# Patient Record
Sex: Male | Born: 1935 | Race: Black or African American | Hispanic: No | Marital: Married | Smoking: Former smoker
Health system: Southern US, Community
[De-identification: ages and names within clinical notes are randomized; demographics above are authoritative.]

## PROBLEM LIST (undated history)

## (undated) DIAGNOSIS — I10 Essential (primary) hypertension: Secondary | ICD-10-CM

## (undated) DIAGNOSIS — E7800 Pure hypercholesterolemia, unspecified: Secondary | ICD-10-CM

## (undated) DIAGNOSIS — C801 Malignant (primary) neoplasm, unspecified: Secondary | ICD-10-CM

## (undated) HISTORY — PX: BACK SURGERY: SHX140

## (undated) HISTORY — PX: JOINT REPLACEMENT: SHX530

## (undated) HISTORY — PX: HERNIA REPAIR: SHX51

---

## 2004-01-18 ENCOUNTER — Encounter: Admission: RE | Admit: 2004-01-18 | Discharge: 2004-01-18 | Payer: Self-pay | Admitting: Orthopedic Surgery

## 2004-01-22 ENCOUNTER — Ambulatory Visit (HOSPITAL_COMMUNITY): Admission: RE | Admit: 2004-01-22 | Discharge: 2004-01-22 | Payer: Self-pay | Admitting: Orthopedic Surgery

## 2004-01-22 ENCOUNTER — Ambulatory Visit (HOSPITAL_BASED_OUTPATIENT_CLINIC_OR_DEPARTMENT_OTHER): Admission: RE | Admit: 2004-01-22 | Discharge: 2004-01-22 | Payer: Self-pay | Admitting: Orthopedic Surgery

## 2005-04-15 ENCOUNTER — Ambulatory Visit (HOSPITAL_COMMUNITY): Admission: RE | Admit: 2005-04-15 | Discharge: 2005-04-15 | Payer: Self-pay | Admitting: Dentistry

## 2005-07-21 ENCOUNTER — Ambulatory Visit (HOSPITAL_COMMUNITY): Admission: RE | Admit: 2005-07-21 | Discharge: 2005-07-21 | Payer: Self-pay | Admitting: General Surgery

## 2006-10-29 ENCOUNTER — Encounter: Admission: RE | Admit: 2006-10-29 | Discharge: 2006-10-29 | Payer: Self-pay | Admitting: Orthopaedic Surgery

## 2006-10-30 ENCOUNTER — Encounter: Admission: RE | Admit: 2006-10-30 | Discharge: 2006-10-30 | Payer: Self-pay | Admitting: Orthopaedic Surgery

## 2008-10-02 ENCOUNTER — Inpatient Hospital Stay (HOSPITAL_COMMUNITY): Admission: RE | Admit: 2008-10-02 | Discharge: 2008-10-06 | Payer: Self-pay | Admitting: Orthopedic Surgery

## 2011-04-14 NOTE — Op Note (Signed)
NAME:  Frank [name], Frank [name] NO.:  [id number]   MEDICAL RECORD NO.:  [id number]          PATIENT TYPE:  INP   LOCATION:  0005                         FACILITY:  [facility]   PHYSICIAN:  Deidre Ala, M.D.    DATE OF BIRTH:  [birth year]   DATE OF PROCEDURE:  10/02/2008  DATE OF DISCHARGE:                               OPERATIVE REPORT   PREOPERATIVE DIAGNOSIS:  End-stage degenerative joint disease, right  knee.   POSTOPERATIVE DIAGNOSIS:  End-stage degenerative joint disease, right  knee.   PROCEDURE:  Right total knee arthroplasty using cemented DePuy  components LCS type with rotating platform and MBT revision stem.   SURGEON:  1. Charlesetta Shanks, M.D.   ASSISTANT:  Phineas Semen, PA-C.   ANESTHESIA:  General with LMA with femoral nerve block.   CULTURES:  None.   DRAINS:  Two medium Hemovacs to self-suction.   ESTIMATED BLOOD LOSS:  100 mL.   REPLACEMENT:  Without.   TOURNIQUET TIME:  1 hour 20 minutes.   PATHOLOGY AND FINDINGS:  Frank [name] has had longstanding knee DJD, narrowing  medial joint line with pain.  He had a posterior medial meniscus tear  and did undergo knee arthroscopy but has had progressive pain slowly  thereafter.  At surgery, he had tricompartmental degenerative joint  disease.  We fitted him to a large right femur, a size large rotating  platform 10 mm, a 5 MBT revision cemented tray, a 75 x 20-mm universal  fluted stem, and an oval dome 3-peg all poly metallic 38 mm.  He had  full extension, which he had preoperatively, with flexion to 115  degrees, good ligament stability and balance.  The patella tracked well.   DESCRIPTION OF PROCEDURE:  With adequate anesthesia obtained, using a  femoral nerve block and LMA technique, 1 gram Ancef given IV prophylaxis  and another one at tourniquet let down.  The patient was placed in the  supine position.  The right lower extremity was prepped from the toes to  the tourniquet in a standard fashion.  After  standard prepping and  draping, a medial parapatellar incision of the skin was followed by a  median retinacular incision.  The incision was deepened sharply, and  adequate hemostasis obtained using the Bovie electrocoagulator.  The  patella was everted.  The fat pad was excised as well as both cruciates  and the remaining menisci.  I then flexed the knee and saw flat tibial  spines.  An intramedullary drill was placed down the tibia and reaming  carried out to a 20.  The tibial cutting jig was then put in place, set  on 6, and appropriately aligned down the shaft.  The cut was made.  We  then sized the femur to a large, placed the intramedullary guide from  the anterior-posterior jig, set it with a 10 pendant, made the anterior  and posterior cuts to fit a 10 lollipop.  We then placed a 4-degree  valgus distal femoral cutting jig in place and set it 5 mm more of a cut  back to fit the pin  in extension, made the cut, and the 10 lollipop fit.  We then placed a finishing guide on the distal femur for the anterior-  posterior chamfer cuts, the notch cut, as well as no need for a far  posterior cut.  We then exposed the tibia, sized to a 5, set it, did the  conical reamer and then the reamer with the 20-mm stem, and placed the  trial.  A 10-mm rotating platform was put in place.  The femoral  component trail was put in place and the knee was articulated through a  range of motion.  We then calibrated the patella to a 25, cut it with a  patellar cutting jig down to a 16, fitting in the 38-mm trial, that  replaces 9 mm.  The patella tracked well.  All trial components were  then removed while thorough jet lavage was carried out at the knee.  The  components were checked for sizing as they came on the field.  We then  assembled the tibial component.  We then mixed vancomycin in the cement,  2 batches, 1 gram per batch.  We then cemented on the tibial component,  impacted it, removed excess  cement.  We then placed a 10-mm rotating  platform.  We then cemented on the femoral component, impacted it,  removed excess cement.  We then held the knee in full extension and  removed additional excess cement.  We then cemented on the patellar  component, impacted it, held it with a clamp and removed excess cement,  and held it until the cement cured.  Additional jet lavage was carried  out.  The tourniquet was then let down and bleeding points cauterized.  FloSeal was placed to the wound, 10 mg.  Hemovac drains were placed in  the medial and lateral gutter and brought out the superolateral portal.  The wound was then closed in layers with #1 Vicryl figure-of-eight  interrupted on the retinaculum with a running locking oversewn #1 PDS,  and 2-0 and 3-0 Vicryl was used on the subcu with skin staples.  Hemovacs were hooked up to self-suction.  A bulky, sterile compressive  dressing was applied with a knee immobilizer and the patient, having  tolerated the procedure well, was awakened and taken to the recovery  room in satisfactory condition.  He will be admitted for routine  postoperative care, CPM, and analgesia.      Deidre Ala, M.D.  Electronically Signed     VEP/MEDQ  D:  10/02/2008  T:  10/02/2008  Job:  [id number]   cc:   Deidre Ala, M.D.  Fax: [phone]

## 2011-04-17 NOTE — Discharge Summary (Signed)
NAME:  [name], [name] NO.:  [id number]   MEDICAL RECORD NO.:  [id number]          PATIENT TYPE:  INP   LOCATION:  1605                         [facility]ACILITY:  [facility]   PHYSICIAN:  Deidre Ala, M.D.    DATE OF BIRTH:  January 26, 1928   DATE OF ADMISSION:  10/02/2008  DATE OF DISCHARGE:  10/06/2008                               DISCHARGE SUMMARY   DIAGNOSES:  1. Degenerative joint disease right knee, chronic pain.  2. Postoperative blood loss anemia.  3. Hyperlipidemia.  4. Hypertension.  5. Gastroesophageal reflux.   HISTORY:  This is a 77 year old African American male who is followed by  Dr. Renae Fickle for chronic knee pain.  He has failed conservative management  and was now ready for total knee arthroplasty.  He was subsequently  scheduled to undergo right total knee arthroplasty.  He was admitted to  [facility] on October 02, 2008.   HOSPITAL COURSE:  Patient after admission underwent total knee  arthroplasty of the right knee.  Patient tolerate procedure well.  No  intraoperative complications occurred.  Postoperatively the patient did  well initially.  Did have the obligatory postoperative blood loss  anemia, in fact he was down to 7.5 by discharge and he did receive blood  products.  He was working with physical therapy and was up and walking  by the third postoperative day.  He was tolerating a regular diet.  Blood work all remained satisfactory except for the hemoglobin and  hematocrit.  As noted on October 06, 2008 patient had a hemoglobin of  7.5, hematocrit 22.3.  His white cell count was 8.3 with platelet of  223,000.  He was subsequently given one unit of packed red blood cells  on October 06, 2008 and then discharged later that same day.   At the time of discharge his medications were:  1. Pravastatin 40 mg daily.  2. Atenolol 12.5 mg daily.  3. Hydrochlorothiazide 12.5 mg daily.  4. Omeprazole 20 mg daily.  5. Aspirin 81 mg daily.  6. Percocet  5/325 one to two p.o. q.4-6 h. p.r.n. for pain, 40 of      these, no refills, for postoperative pain.  7. He was continued on Lovenox 30 mg subcutaneous q.12 hours for the      next 10 days.  8. Robaxin 750 one p.o. q.i.d. for muscle spasm.   He was subsequently discharged on October 06, 2008 in satisfactory and  stable condition.  Follow up with Dr. Renae Fickle in 10 days.      Phineas Semen, Wynonia Hazard, M.D.  Electronically Signed    CL/MEDQ  D:  11/13/2008  T:  11/13/2008  Job:  [id number]

## 2011-04-17 NOTE — Op Note (Signed)
NAME:  Frank, [name]               ACCOUNT NO.:  [id number]   MEDICAL RECORD NO.:  [id number]          PATIENT TYPE:  AMB   LOCATION:  SDS                          FACILITY:  [facility]   PHYSICIAN:  Sharlet Salina T. Hoxworth, M.D.DATE OF BIRTH:  1928/08/10   DATE OF PROCEDURE:  07/21/2005  DATE OF DISCHARGE:  07/21/2005                                 OPERATIVE REPORT   PREOPERATIVE DIAGNOSIS:  Left inguinal hernia.   POSTOPERATIVE DIAGNOSIS:  Left inguinal hernia.   SURGICAL PROCEDURE:  Repair of left inguinal hernia.   SURGEON:  Lorne Skeens. Hoxworth, M.D.   ANESTHESIA:  Local IV sedation.   BRIEF HISTORY:  Mr. Frank [name] is a 77 year old male who presents with a  symptomatic reducible left inguinal hernia confirmed on exam.  Options for  repair have been discussed and we have elected proceed with open repair  using mesh under local anesthesia with sedation.  The nature of the  procedure, its indications, risks of bleeding, infection, recurrence and  chronic pain were discussed and understood.  He is now brought to the  operating room for this procedure.   The patient was brought to the operating room and placed in supine position  on the operating table and IV sedation was administered.  The right groin  was sterilely prepped and draped.  He received preoperative IV antibiotics.  Correct patient and procedure were verified.  Local anesthesia was used to  infiltrate the ilioinguinal nerve and infiltrate the area of the incision.  An oblique incision was made in the left groin and dissection carried down  through subcutaneous tissue.  Crossing veins were doubly clamped, divided  and ligated with 3-0 Vicryl.  The external oblique was identified,  anesthetized and divided along the lines of its fibers through the external  ring.  The ilioinguinal nerve was identified, dissected free and protected  throughout the remainder of the procedure.  The inguinal ligament, cord and  rectus sheath were  anesthetized.  The cord was dissected up off the floor of  the pubic tubercle and cremasteric fibers divided bilaterally up to the  internal ring, completely freeing the cord.  There was no direct defect.  Dissection within the cord revealed a good-sized cord lipoma that was  completely dissected free from cord structures, clamped, divided and removed  at the internal ring and ligated with 3-0 Vicryl.  There was also a moderate-  sized indirect peritoneal sac that was dissected away from cord structures  up to and above the level of the internal ring, at which point it was suture-  ligated and divided.  A piece of Parietex mesh was then trimmed to fit the  floor in the inguinal canal with tails to surround the cord  at the internal  ring.  It was sutured to the pubic tubercle and then to the inguinal  ligament and iliopubic tract with running 2-0 Prolene, working medial to  lateral until the suture line was lateral to the internal ring.  Medially,  the mesh was sutured to the edge of the rectus sheath with interrupted 2-0  Prolene.  The  tails were then tacked together lateral to the cord with  interrupted 2-0 Prolene, creating a new internal ring snug to the fingertip.  This provided nice broad coverage of the direct and indirect spaces.  The  cord and ilioinguinal nerves were returned to their anatomic position.  The  external oblique was closed over this  with running 3-0 Vicryl.  Scarpa fascia was closed with running 3-0 Vicryl  and the skin with running subcuticular 4-0 Monocryl and Steri-Strips.  Sponge, needle and instrument counts were correct.  Dry sterile dressing was  applied and the patient taken to the recovery in good condition.      Lorne Skeens. Hoxworth, M.D.  Electronically Signed     BTH/MEDQ  D:  07/21/2005  T:  07/22/2005  Job:  [id number]

## 2011-09-01 LAB — COMPREHENSIVE METABOLIC PANEL
ALT: 30
AST: 28
Albumin: 3.9
CO2: 28
Chloride: 101
GFR calc Af Amer: >60
GFR calc non Af Amer: >60
Sodium: 137
Total Bilirubin: 0.8

## 2011-09-01 LAB — BASIC METABOLIC PANEL
CO2: 29
CO2: 29
Calcium: 7.9 — ABNORMAL LOW
Chloride: 101
Creatinine, Ser: 1.03
GFR calc Af Amer: >60
GFR calc Af Amer: >60
Glucose, Bld: 137 — ABNORMAL HIGH
Potassium: 3.9
Sodium: 134 — ABNORMAL LOW
Sodium: 136

## 2011-09-01 LAB — URINE CULTURE
Colony Count: NO GROWTH
Culture: NO GROWTH

## 2011-09-01 LAB — DIFFERENTIAL
Basophils Absolute: 0
Eosinophils Relative: 3
Lymphs Abs: 1.8
Monocytes Absolute: 0.8
Neutro Abs: 2.1

## 2011-09-01 LAB — CBC
HCT: 22.3 — ABNORMAL LOW
HCT: 27.8 — ABNORMAL LOW
Hemoglobin: 10.4 — ABNORMAL LOW
Hemoglobin: 7.5 — CL
Hemoglobin: 9.3 — ABNORMAL LOW
MCHC: 33.3
MCHC: 33.4
MCHC: 33.6
MCV: 101.5 — ABNORMAL HIGH
MCV: 99.8
Platelets: 190
RBC: 4.21 — ABNORMAL LOW
RBC: 2.74 — ABNORMAL LOW
RBC: 3.1 — ABNORMAL LOW
RDW: 12.7
RDW: 12.8
WBC: 4.8
WBC: 5.9

## 2011-09-01 LAB — URINALYSIS, ROUTINE W REFLEX MICROSCOPIC
Bilirubin Urine: NEGATIVE
Glucose, UA: NEGATIVE
Hgb urine dipstick: NEGATIVE
Ketones, ur: NEGATIVE
pH: 7

## 2011-09-01 LAB — ABO/RH: ABO/RH(D): O POS

## 2011-09-01 LAB — CROSSMATCH

## 2011-09-01 LAB — PROTIME-INR: Prothrombin Time: 13.3

## 2011-09-01 LAB — TYPE AND SCREEN: Antibody Screen: NEGATIVE

## 2015-08-01 ENCOUNTER — Encounter (HOSPITAL_COMMUNITY): Payer: Self-pay | Admitting: Emergency Medicine

## 2015-08-01 ENCOUNTER — Emergency Department (HOSPITAL_COMMUNITY)
Admission: EM | Admit: 2015-08-01 | Discharge: 2015-08-01 | Disposition: A | Discharge status: Home / Self Care | Payer: Medicare Other | Attending: Emergency Medicine | Admitting: Emergency Medicine

## 2015-08-01 ENCOUNTER — Emergency Department (HOSPITAL_COMMUNITY): Payer: Medicare Other

## 2015-08-01 DIAGNOSIS — Y9301 Activity, walking, marching and hiking: Secondary | ICD-10-CM | POA: Insufficient documentation

## 2015-08-01 DIAGNOSIS — W1839XA Other fall on same level, initial encounter: Secondary | ICD-10-CM | POA: Diagnosis not present

## 2015-08-01 DIAGNOSIS — Y92194 Driveway of other specified residential institution as the place of occurrence of the external cause: Secondary | ICD-10-CM | POA: Diagnosis not present

## 2015-08-01 DIAGNOSIS — Z7982 Long term (current) use of aspirin: Secondary | ICD-10-CM | POA: Insufficient documentation

## 2015-08-01 DIAGNOSIS — Y999 Unspecified external cause status: Secondary | ICD-10-CM | POA: Diagnosis not present

## 2015-08-01 DIAGNOSIS — Z79899 Other long term (current) drug therapy: Secondary | ICD-10-CM | POA: Diagnosis not present

## 2015-08-01 DIAGNOSIS — S4991XA Unspecified injury of right shoulder and upper arm, initial encounter: Secondary | ICD-10-CM | POA: Diagnosis not present

## 2015-08-01 MED ORDER — OXYCODONE-ACETAMINOPHEN 5-325 MG PO TABS
1.0000 | ORAL_TABLET | Freq: Once | ORAL | Status: AC
  Administered 2015-08-01: 1 via ORAL
  Filled 2015-08-01: qty 1

## 2015-08-01 NOTE — ED Notes (Signed)
Patient returned from X-ray 

## 2015-08-01 NOTE — ED Notes (Addendum)
Fall with right shoulder pain.  Patient states he was walking in his driveway with his wife who fell and pulled him down as well.  Landed on right shoulder in the yard.  No visible deformity. Pain with adduction.  Pulses and sensation intact.

## 2015-08-01 NOTE — ED Provider Notes (Signed)
CSN: [id number]     Arrival date & time 08/01/15  1046 History   First MD Initiated Contact with Patient 08/01/15 1053     Chief Complaint  Patient presents with  . Fall  . Shoulder Injury   HPI  Frank [name] is an 81 year old male presenting after a fall. He was walking up the driveway with his wife this morning when she fell and dragged him down onto the grass. He reports right shoulder pain. Pain increases with movement of the arm. Denies numbness, tingling, weakness or pain in the hand or fingertips. Denies head injury, LOC or other injuries sustained in the fall. Denies fevers, headaches, vision changes, neck pain, chest pain, SOB, abdominal pain, nausea, vomiting or weakness. Takes aspirin daily, denies other blood thinners.   No past medical history on file. No past surgical history on file. No family history on file. Social History  Substance Use Topics  . Smoking status: Never Smoker   . Smokeless tobacco: None  . Alcohol Use: Yes     Comment: drinks a thimble of brandy with coffee every morning    Review of Systems  Constitutional: Negative for fever and chills.  Eyes: Negative for visual disturbance.  Respiratory: Negative for shortness of breath.   Cardiovascular: Negative for chest pain.  Gastrointestinal: Negative for nausea, vomiting and abdominal pain.  Musculoskeletal: Positive for arthralgias. Negative for back pain, joint swelling and neck pain.  Skin: Negative for wound.  Neurological: Negative for dizziness, syncope, weakness, light-headedness, numbness and headaches.      Allergies  Review of patient's allergies indicates no known allergies.  Home Medications   Prior to Admission medications   Medication Sig Start Date End Date Taking? Authorizing Provider  aspirin EC 81 MG tablet Take 81 mg by mouth daily.   Yes Historical Provider, MD  atenolol (TENORMIN) 50 MG tablet Take 25 mg by mouth daily.   Yes Historical Provider, MD  hydrochlorothiazide  (HYDRODIURIL) 25 MG tablet Take 12.5 mg by mouth daily.   Yes Historical Provider, MD  omeprazole (PRILOSEC) 20 MG capsule Take 20 mg by mouth daily.   Yes Historical Provider, MD  pravastatin (PRAVACHOL) 40 MG tablet Take 40 mg by mouth every evening.   Yes Historical Provider, MD  vitamin B-12 (CYANOCOBALAMIN) 100 MCG tablet Take 100 mcg by mouth daily.   Yes Historical Provider, MD   BP 143/80 mmHg  Pulse 70  Temp(Src) 97.7 F (36.5 C) (Oral)  Resp 16  Ht '5\' 11"'$  (1.803 m)  Wt 140 lb (63.504 kg)  BMI 19.53 kg/m2  SpO2 96% Physical Exam  Constitutional: He is oriented to person, place, and time. He appears well-developed and well-nourished. No distress.  HENT:  Head: Normocephalic and atraumatic.  Eyes: Conjunctivae and EOM are normal. Pupils are equal, round, and reactive to light.  Neck: Normal range of motion.  Cardiovascular: Normal rate, regular rhythm and normal heart sounds.   Cap refill < 3 Radial pulse in right arm palpable  Pulmonary/Chest: Effort normal and breath sounds normal. No respiratory distress. He has no wheezes.  Abdominal: Soft. There is no tenderness.  Musculoskeletal:  Full active ROM of elbow, wrist and hands intact. Passive ROM elicits pain on flexion, extension and adduction of right shoulder. Pt unable to actively flex, extend or abduct right shoulder 2/2 pain. Right shoulder mildly TTP. No palpable deformities of humeral head.  Neurological: He is alert and oriented to person, place, and time.  5/5 grip and biceps  strength. Pt would not test shoulder strength 2/2 to pain. Sensation intact to light touch over arm.   Skin: Skin is warm and dry.  No wounds.  Psychiatric: He has a normal mood and affect. His behavior is normal.  Nursing note and vitals reviewed.   ED Course  Procedures (including critical care time) Labs Review Labs Reviewed - No data to display  Imaging Review Dg Shoulder Right  08/01/2015   CLINICAL DATA:  [place] and complains of  right anterior shoulder pain. Limited range of motion.  EXAM: RIGHT SHOULDER - 2+ VIEW  COMPARISON:  None.  FINDINGS: No evidence for fracture or dislocation. The right humeral head is elevated and suspect underlying rotator cuff disease. Right AC joint is intact.  IMPRESSION: No acute bone abnormality.  Elevation of the right humeral head. Findings can be associated with underlying rotator cuff disease.   Electronically Signed   By: Markus Daft M.D.   On: 08/01/2015 12:15   I have personally reviewed and evaluated these images and lab results as part of my medical decision-making.   EKG Interpretation None      MDM   Final diagnoses:  Right shoulder injury, initial encounter   Pt presenting after a mechanical fall this morning. Complaining of right shoulder pain. VSS, pt nontoxic with no obvious deformities. Pt unable to flex, extend or adduct the arm, will not try 2/2 to pain. Pt with pain on passive flexion and adduction of right arm. Arm neurovascularly intact. Shoulder xray shows no acute injury with elevation of humeral head that may be associated with rotator cuff disease. Pt also requesting social work consult in ED because he feels that he can no longer care for his wife who is suffering from dementia. Placed call to social work who said they would come see him. Pt requesting to leave before social work came by because he is tired of waiting. Requesting an arm sling. Offered pain medication but pt states he has some at home and just wants to leave. Waiting at nurses desk dressed and asking for discharge paperwork. Pt given referral to Dr. Ninfa Linden at [facility] and told to schedule an appointment to discuss right shoulder injury tomorrow. Instructed to rest and ice shoulder. Use home or OTC pain medications for relief. Return precautions given in discharge paperwork.     Josephina Gip, PA-C 08/01/15 1842  Lacretia Leigh, MD 08/02/15 [phone]

## 2015-08-01 NOTE — Discharge Instructions (Signed)
-   Call Dr. Ninfa Linden at [facility] for right shoulder injury follow up - Use sling on right arm - OTC tylenol or motrin for pain control - Return to ED if your arm begins to swell, feel numb or tingling, you cannot feel your arm or further worsening of symptoms

## 2016-11-03 DIAGNOSIS — Z1389 Encounter for screening for other disorder: Secondary | ICD-10-CM | POA: Diagnosis not present

## 2016-11-03 DIAGNOSIS — I10 Essential (primary) hypertension: Secondary | ICD-10-CM | POA: Diagnosis not present

## 2016-11-03 DIAGNOSIS — Z0000 Encounter for general adult medical examination without abnormal findings: Secondary | ICD-10-CM | POA: Diagnosis not present

## 2016-11-03 DIAGNOSIS — E7800 Pure hypercholesterolemia, unspecified: Secondary | ICD-10-CM | POA: Diagnosis not present

## 2016-11-03 DIAGNOSIS — R5383 Other fatigue: Secondary | ICD-10-CM | POA: Diagnosis not present

## 2017-11-16 DIAGNOSIS — E7800 Pure hypercholesterolemia, unspecified: Secondary | ICD-10-CM | POA: Diagnosis not present

## 2017-11-16 DIAGNOSIS — I10 Essential (primary) hypertension: Secondary | ICD-10-CM | POA: Diagnosis not present

## 2017-11-16 DIAGNOSIS — Z0000 Encounter for general adult medical examination without abnormal findings: Secondary | ICD-10-CM | POA: Diagnosis not present

## 2017-11-16 DIAGNOSIS — M25519 Pain in unspecified shoulder: Secondary | ICD-10-CM | POA: Diagnosis not present

## 2018-08-29 ENCOUNTER — Encounter (HOSPITAL_COMMUNITY): Payer: Self-pay | Admitting: Emergency Medicine

## 2018-08-29 ENCOUNTER — Emergency Department (HOSPITAL_COMMUNITY): Payer: Medicare Other

## 2018-08-29 ENCOUNTER — Emergency Department (HOSPITAL_COMMUNITY)
Admission: EM | Admit: 2018-08-29 | Discharge: 2018-08-30 | Disposition: A | Discharge status: Home / Self Care | Payer: Medicare Other | Attending: Emergency Medicine | Admitting: Emergency Medicine

## 2018-08-29 DIAGNOSIS — Y929 Unspecified place or not applicable: Secondary | ICD-10-CM | POA: Diagnosis not present

## 2018-08-29 DIAGNOSIS — Z79899 Other long term (current) drug therapy: Secondary | ICD-10-CM | POA: Diagnosis not present

## 2018-08-29 DIAGNOSIS — Y9301 Activity, walking, marching and hiking: Secondary | ICD-10-CM | POA: Diagnosis not present

## 2018-08-29 DIAGNOSIS — Z23 Encounter for immunization: Secondary | ICD-10-CM | POA: Diagnosis not present

## 2018-08-29 DIAGNOSIS — Z7982 Long term (current) use of aspirin: Secondary | ICD-10-CM | POA: Diagnosis not present

## 2018-08-29 DIAGNOSIS — S0181XA Laceration without foreign body of other part of head, initial encounter: Secondary | ICD-10-CM | POA: Diagnosis not present

## 2018-08-29 DIAGNOSIS — I10 Essential (primary) hypertension: Secondary | ICD-10-CM | POA: Diagnosis not present

## 2018-08-29 DIAGNOSIS — S0990XA Unspecified injury of head, initial encounter: Secondary | ICD-10-CM

## 2018-08-29 DIAGNOSIS — Y999 Unspecified external cause status: Secondary | ICD-10-CM | POA: Insufficient documentation

## 2018-08-29 DIAGNOSIS — W0110XA Fall on same level from slipping, tripping and stumbling with subsequent striking against unspecified object, initial encounter: Secondary | ICD-10-CM | POA: Insufficient documentation

## 2018-08-29 DIAGNOSIS — W19XXXA Unspecified fall, initial encounter: Secondary | ICD-10-CM

## 2018-08-29 DIAGNOSIS — S199XXA Unspecified injury of neck, initial encounter: Secondary | ICD-10-CM | POA: Diagnosis not present

## 2018-08-29 DIAGNOSIS — Z7902 Long term (current) use of antithrombotics/antiplatelets: Secondary | ICD-10-CM | POA: Diagnosis not present

## 2018-08-29 HISTORY — DX: Essential (primary) hypertension: I10

## 2018-08-29 HISTORY — DX: Pure hypercholesterolemia, unspecified: E78.00

## 2018-08-29 MED ORDER — LIDOCAINE HCL (PF) 1 % IJ SOLN
5.0000 mL | Freq: Once | INTRAMUSCULAR | Status: AC
  Administered 2018-08-29: 5 mL
  Filled 2018-08-29: qty 5

## 2018-08-29 MED ORDER — TETANUS-DIPHTH-ACELL PERTUSSIS 5-2.5-18.5 LF-MCG/0.5 IM SUSP
0.5000 mL | Freq: Once | INTRAMUSCULAR | Status: AC
  Administered 2018-08-29: 0.5 mL via INTRAMUSCULAR
  Filled 2018-08-29: qty 0.5

## 2018-08-29 NOTE — ED Triage Notes (Signed)
Pt presents to ER with lac above R eye; pt states he got up to go to the restroom and fell and struck head on hardwood floor in hallway outside bathroom; pt denies LOC; pt states he had had an alcoholic drink tonight that might have contributed; pt denied feeling dizzy or lightheaded prior to, denies slipping or tripping

## 2018-08-30 LAB — URINALYSIS, ROUTINE W REFLEX MICROSCOPIC
BILIRUBIN URINE: NEGATIVE
Glucose, UA: NEGATIVE mg/dL
Hgb urine dipstick: NEGATIVE
KETONES UR: NEGATIVE mg/dL
Leukocytes, UA: NEGATIVE
NITRITE: NEGATIVE
PH: 6 (ref 5.0–8.0)
Protein, ur: NEGATIVE mg/dL
Specific Gravity, Urine: 1.011 (ref 1.005–1.030)

## 2018-08-30 LAB — CBC WITH DIFFERENTIAL/PLATELET
Abs Immature Granulocytes: 0 10*3/uL (ref 0.0–0.1)
BASOS ABS: 0 10*3/uL (ref 0.0–0.1)
BASOS PCT: 1 %
Eosinophils Absolute: 0.1 10*3/uL (ref 0.0–0.7)
Eosinophils Relative: 2 %
HCT: 40.3 % (ref 39.0–52.0)
Hemoglobin: 12.7 g/dL — ABNORMAL LOW (ref 13.0–17.0)
IMMATURE GRANULOCYTES: 0 %
Lymphocytes Relative: 39 %
Lymphs Abs: 1.6 10*3/uL (ref 0.7–4.0)
MCH: 33 pg (ref 26.0–34.0)
MCHC: 31.5 g/dL (ref 30.0–36.0)
MCV: 104.7 fL — AB (ref 78.0–100.0)
MONOS PCT: 13 %
Monocytes Absolute: 0.5 10*3/uL (ref 0.1–1.0)
NEUTROS ABS: 1.9 10*3/uL (ref 1.7–7.7)
NEUTROS PCT: 45 %
PLATELETS: 277 10*3/uL (ref 150–400)
RBC: 3.85 MIL/uL — ABNORMAL LOW (ref 4.22–5.81)
RDW: 12.4 % (ref 11.5–15.5)
WBC: 4.2 10*3/uL (ref 4.0–10.5)

## 2018-08-30 LAB — BASIC METABOLIC PANEL
Anion gap: 12 (ref 5–15)
BUN: 13 mg/dL (ref 8–23)
CALCIUM: 9 mg/dL (ref 8.9–10.3)
CO2: 26 mmol/L (ref 22–32)
CREATININE: 0.8 mg/dL (ref 0.61–1.24)
Chloride: 101 mmol/L (ref 98–111)
GFR calc non Af Amer: >60 mL/min (ref >60)
GLUCOSE: 122 mg/dL — AB (ref 70–99)
Potassium: 3.6 mmol/L (ref 3.5–5.1)
Sodium: 139 mmol/L (ref 135–145)

## 2018-08-30 NOTE — ED Provider Notes (Signed)
[facility] EMERGENCY DEPARTMENT Provider Note   CSN: [id number] Arrival date & time: 08/29/18  2230     History   Chief Complaint Chief Complaint  Patient presents with  . Laceration  . Fall    HPI Frank [name] is a 84 y.o. male with history of hypertension and hyperlipidemia who presents emergency department today for a fall.  Patient reports that he has been drinking this evening.  He reports 3 tall glasses of brandy earlier today.  He reports that he was getting up from the couch to use the restroom when the next thing he knew he was on the floor.  He denies any loss of consciousness.  He is not sure why he fell.  He denies any preceding headache, visual changes, diplopia, facial droop, difficulty with speech, focal weakness, numbness/tingling, chest pain, shortness of breath, abdominal pain or other symptoms.  Wife is at bedside did not witness this.  No new medications.  Patient reports he has baseline urinary incontinence/dribbling that is unchanged.  He denies any dysuria, hematuria. No bowel incontinence or urinary retention. No new changes in gait.  He walks at baseline without a cane or walker.  Lives at home with his wife who is at bedside.  No recent fever, chills, cough, nausea/vomiting/diarrhea. Patient reports this is a new issue. No recent falls in last 6 months prior. He is followed by the [facility] in [place].   HPI  Past Medical History:  Diagnosis Date  . High cholesterol   . Hypertension     There are no active problems to display for this patient.   Past Surgical History:  Procedure Laterality Date  . BACK SURGERY    . JOINT REPLACEMENT          Home Medications    Prior to Admission medications   Medication Sig Start Date End Date Taking? Authorizing Provider  aspirin EC 81 MG tablet Take 81 mg by mouth daily.    [provider]  atenolol (TENORMIN) 50 MG tablet Take 25 mg by mouth daily.    [provider]    hydrochlorothiazide (HYDRODIURIL) 25 MG tablet Take 12.5 mg by mouth daily.    [provider]  omeprazole (PRILOSEC) 20 MG capsule Take 20 mg by mouth daily.    [provider]  pravastatin (PRAVACHOL) 40 MG tablet Take 40 mg by mouth every evening.    [provider]  vitamin B-12 (CYANOCOBALAMIN) 100 MCG tablet Take 100 mcg by mouth daily.    [provider]    Family History History reviewed. No pertinent family history.  Social History Social History   Tobacco Use  . Smoking status: Never Smoker  Substance Use Topics  . Alcohol use: Yes    Comment: drinks a thimble of brandy with coffee every morning  . Drug use: No     Allergies   Patient has no known allergies.   Review of Systems Review of Systems  All other systems reviewed and are negative.    Physical Exam Updated Vital Signs BP 129/77 (BP Location: Right Arm)   Pulse 80   Temp 98.2 F (36.8 C) (Oral)   Resp 15   Ht 5\' 11"  (1.803 m)   Wt 68 kg   SpO2 97%   BMI 20.92 kg/m   Physical Exam  Constitutional: He is oriented to person, place, and time. He appears well-developed and well-nourished.  HENT:  Head: Normocephalic and atraumatic. Head is without raccoon's  eyes and without Battle's sign.    Right Ear: External ear normal. No hemotympanum.  Left Ear: External ear normal. No hemotympanum.  Nose: No sinus tenderness or nasal septal hematoma.  Mouth/Throat: Uvula is midline, oropharynx is clear and moist and mucous membranes are normal. No tonsillar exudate.  No palpable open or depressed skull fx.   Eyes: Pupils are equal, round, and reactive to light. Right eye exhibits no discharge. Left eye exhibits no discharge. No scleral icterus.  Neck: Trachea normal. Neck supple. No spinous process tenderness present. No neck rigidity. Normal range of motion present.  No c-spine ttp or step offs.   Cardiovascular: Normal rate, regular rhythm and intact distal pulses.   No murmur heard. Pulses:      Radial pulses are 2+ on the right side, and 2+ on the left side.       Dorsalis pedis pulses are 2+ on the right side, and 2+ on the left side.       Posterior tibial pulses are 2+ on the right side, and 2+ on the left side.  No lower extremity swelling or edema. Calves symmetric in size bilaterally.  Pulmonary/Chest: Effort normal and breath sounds normal. He exhibits no tenderness.  Abdominal: Soft. Bowel sounds are normal. There is no tenderness. There is no rigidity, no rebound and no guarding.  Musculoskeletal: He exhibits no edema.  No thoracic or lumbar spinous ttp or step offs.   Lymphadenopathy:    He has no cervical adenopathy.  Neurological: He is alert and oriented to person, place, and time. He has normal strength. He is not disoriented. No cranial nerve deficit or sensory deficit. GCS eye subscore is 4. GCS verbal subscore is 5. GCS motor subscore is 6.  Mental Status:  Alert, oriented, thought content appropriate, able to give a coherent history. Speech fluent without evidence of aphasia. Able to follow 2 step commands without difficulty.  Cranial Nerves:  II:  Peripheral visual fields grossly normal, pupils equal, round, reactive to light III,IV, VI: ptosis not present, extra-ocular motions intact bilaterally  V,VII: smile symmetric, eyebrows raise symmetric, facial light touch sensation equal VIII: hearing grossly normal to voice  X: uvula elevates symmetrically  XI: bilateral shoulder shrug symmetric and strong XII: midline tongue extension without fassiculations Motor:  Normal tone. 5/5 in upper and lower extremities bilaterally including strong and equal grip strength and dorsiflexion/plantar flexion Sensory: Sensation intact to light touch in all extremities. Deep Tendon Reflexes: 2+ and symmetric in the biceps and patella Cerebellar: normal finger-to-nose with bilateral upper extremities. No pronator drift.  Gait: patient with wide  shuffling gait (wife states this is his baseline) CV: distal pulses palpable throughout   Skin: Skin is warm and dry. Laceration noted. No rash noted. He is not diaphoretic.  Psychiatric: He has a normal mood and affect.  Nursing note and vitals reviewed.  ED Treatments / Results  Labs (all labs ordered are listed, but only abnormal results are displayed) Labs Reviewed  URINALYSIS, ROUTINE W REFLEX MICROSCOPIC  BASIC METABOLIC PANEL  CBC WITH DIFFERENTIAL/PLATELET    EKG None  Radiology Ct Head Wo Contrast  Result Date: 08/30/2018 CLINICAL DATA:  Fall striking head on hardwood floor in hallway outside of bathroom. Laceration above right eye. No loss of consciousness. Head trauma, minor, GCS>=13, high clinical risk, initial exam; C-spine trauma, high clinical risk (NEXUS/CCR) EXAM: CT HEAD WITHOUT CONTRAST CT CERVICAL SPINE WITHOUT CONTRAST TECHNIQUE: Multidetector CT imaging of the head and cervical spine was performed  following the standard protocol without intravenous contrast. Multiplanar CT image reconstructions of the cervical spine were also generated. COMPARISON:  None. FINDINGS: CT HEAD FINDINGS Brain: Generalized atrophy. Ventriculomegaly is likely secondary to central atrophy. Mild chronic small vessel ischemia. No intracranial hemorrhage, mass effect, or midline shift. No evidence of territorial infarct or acute ischemia. No extra-axial or intracranial fluid collection. Vascular: Atherosclerosis of skullbase vasculature without hyperdense vessel or abnormal calcification. Skull: No fracture or focal lesion. Scattered arachnoid granulations. Sinuses/Orbits: Paranasal sinuses and mastoid air cells are clear. The visualized orbits are unremarkable. Other: None. CT CERVICAL SPINE FINDINGS Alignment: 3 mm anterolisthesis of C3 on C4 and 3 mm anterolisthesis of C7 on T1 likely degenerative and facet mediated. Mild rightward curvature of the cervical spine. No traumatic subluxation. Skull  base and vertebrae: No acute fracture. Dens and skull base are intact. Tiny benign-appearing sclerotic density in the transverse process of left T2. Soft tissues and spinal canal: No prevertebral fluid or swelling. No visible canal hematoma. Disc levels: Advanced disc space narrowing from C4-C5 through C6-C7 with endplate spurring. Lesser disc space narrowing at C2-C3. Prominent facet arthropathy throughout the cervical spine. Multilevel neural foraminal stenosis. Upper chest: No acute findings. Other: None. IMPRESSION: 1.  No acute intracranial abnormality.  No skull fracture. 2. Generalized atrophy. Ventriculomegaly is likely secondary to central atrophy. 3. Advanced degenerative change in the cervical spine without acute fracture. Electronically Signed   By: Keith Rake M.D.   On: 08/30/2018 00:19   Ct Cervical Spine Wo Contrast  Result Date: 08/30/2018 CLINICAL DATA:  Fall striking head on hardwood floor in hallway outside of bathroom. Laceration above right eye. No loss of consciousness. Head trauma, minor, GCS>=13, high clinical risk, initial exam; C-spine trauma, high clinical risk (NEXUS/CCR) EXAM: CT HEAD WITHOUT CONTRAST CT CERVICAL SPINE WITHOUT CONTRAST TECHNIQUE: Multidetector CT imaging of the head and cervical spine was performed following the standard protocol without intravenous contrast. Multiplanar CT image reconstructions of the cervical spine were also generated. COMPARISON:  None. FINDINGS: CT HEAD FINDINGS Brain: Generalized atrophy. Ventriculomegaly is likely secondary to central atrophy. Mild chronic small vessel ischemia. No intracranial hemorrhage, mass effect, or midline shift. No evidence of territorial infarct or acute ischemia. No extra-axial or intracranial fluid collection. Vascular: Atherosclerosis of skullbase vasculature without hyperdense vessel or abnormal calcification. Skull: No fracture or focal lesion. Scattered arachnoid granulations. Sinuses/Orbits: Paranasal  sinuses and mastoid air cells are clear. The visualized orbits are unremarkable. Other: None. CT CERVICAL SPINE FINDINGS Alignment: 3 mm anterolisthesis of C3 on C4 and 3 mm anterolisthesis of C7 on T1 likely degenerative and facet mediated. Mild rightward curvature of the cervical spine. No traumatic subluxation. Skull base and vertebrae: No acute fracture. Dens and skull base are intact. Tiny benign-appearing sclerotic density in the transverse process of left T2. Soft tissues and spinal canal: No prevertebral fluid or swelling. No visible canal hematoma. Disc levels: Advanced disc space narrowing from C4-C5 through C6-C7 with endplate spurring. Lesser disc space narrowing at C2-C3. Prominent facet arthropathy throughout the cervical spine. Multilevel neural foraminal stenosis. Upper chest: No acute findings. Other: None. IMPRESSION: 1.  No acute intracranial abnormality.  No skull fracture. 2. Generalized atrophy. Ventriculomegaly is likely secondary to central atrophy. 3. Advanced degenerative change in the cervical spine without acute fracture. Electronically Signed   By: Keith Rake M.D.   On: 08/30/2018 00:19    Procedures .Marland KitchenLaceration Repair Date/Time: 08/30/2018 1:04 AM Performed by: Jillyn Ledger, PA-C Authorized by: Alferd Apa  M, PA-C   Consent:    Consent obtained:  Verbal   Consent given by:  Patient   Risks discussed:  Infection, need for additional repair, nerve damage, poor wound healing, poor cosmetic result, pain, retained foreign body, tendon damage and vascular damage   Alternatives discussed:  No treatment Anesthesia (see MAR for exact dosages):    Anesthesia method:  Local infiltration   Local anesthetic:  Lidocaine 1% w/o epi Laceration details:    Location:  Face   Face location:  Forehead   Length (cm):  2.8 Repair type:    Repair type:  Simple Pre-procedure details:    Preparation:  Patient was prepped and draped in usual sterile fashion and imaging  obtained to evaluate for foreign bodies Exploration:    Hemostasis achieved with:  Direct pressure   Wound exploration: wound explored through full range of motion and entire depth of wound probed and visualized     Wound extent: no foreign bodies/material noted     Contaminated: no   Treatment:    Area cleansed with:  Saline and soap and water   Amount of cleaning:  Standard   Irrigation solution:  Sterile saline   Irrigation method:  Syringe   Visualized foreign bodies/material removed: no   Skin repair:    Repair method:  Sutures   Suture size:  6-0   Wound skin closure material used: Ethilon.   Suture technique:  Simple interrupted   Number of sutures:  4 Approximation:    Approximation:  Close Post-procedure details:    Dressing:  Non-adherent dressing   Patient tolerance of procedure:  Tolerated well, no immediate complications   (including critical care time)   Medications Ordered in ED Medications  Tdap (BOOSTRIX) injection 0.5 mL (0.5 mLs Intramuscular Given 08/29/18 2304)  lidocaine (PF) (XYLOCAINE) 1 % injection 5 mL (5 mLs Infiltration Given 08/29/18 2305)     Initial Impression / Assessment and Plan / ED Course  I have reviewed the triage vital signs and the nursing notes.  Pertinent labs & imaging results that were available during my care of the patient were reviewed by me and considered in my medical decision making (see chart for details).     84 y.o. male with hx of HTN and HLD who presents after a fall for unknown reason after several drinks tonight. Denies preceding symptoms or syncope. Patent without any focal neurologic findings on exam. Patinet does have a wide shuffling gait which wife states is baseline. He denies any infectious symptoms.  Reports baseline urinary continence but denies any dysuria or hematuria.  Given unknown reason for fall, EKG and basic labs will be checked. CT head and neck ordered and reviewed. This shows no acute intracranial  abnormalities.  No evidence of intracranial hemorrhage, skull fracture or cervical spine injury.  There is noted to be general atrophy with ventriculomegaly (likely 2/2 atrophy). NPH was considered in differential and discussed with my attending. Patient without hx of falls prior and with baseline gait per wife. Will have patient follow up with PCP in regards to this. He may benefit from neurology/neurosurgery follow up as outpatient in the future if he has another fall.  Patient made aware of this and states understanding. Patient was also noted to have a laceration for which the wound was explored and base of the wound was visualized in a bloodless field without evidence of foreign body.  Laceration was repaired using 6-0 Ethilon.  Laceration occurred less than 18 hours  prior to repair time.  Tetanus was updated.  He will require follow-up in 7 days for suture removal.  With lab work and EKG still pending. Case signed out to [employer], [place]. Patient case discussed with Dr. Leonette Monarch who is in agreement with plan.  Final Clinical Impressions(s) / ED Diagnoses   Final diagnoses:  None    ED Discharge Orders    None       Jillyn Ledger, PA-C 08/30/18 0141    Fatima Blank, MD 08/30/18 [phone]

## 2018-08-30 NOTE — Discharge Instructions (Addendum)
Your CT shows no acute injuries.  It does show large ventricles in your brain.  Please discuss this with your doctor.  You can see your doctor for suture removal in 5-7 days.  Return to the ER for new or worsening symptoms.

## 2018-08-30 NOTE — ED Notes (Signed)
Patient ambulated the hallway steadily without getting dizzy or lightheaded.

## 2018-08-30 NOTE — ED Provider Notes (Signed)
Patient signed out to me pending lab work.  CT head and cervical spine are negative for acute injuries.  Sutures placed by prior provider.  Patient ambulates with steady gait.  He is not orthostatic.  Laboratory work-up is reassuring.  We will discharged home with PCP follow-up.   Mikaele, Stecher, PA-C 08/30/18 [id number]    Fatima Blank, MD 08/30/18 [phone]

## 2018-09-05 ENCOUNTER — Ambulatory Visit (HOSPITAL_COMMUNITY)
Admission: EM | Admit: 2018-09-05 | Discharge: 2018-09-05 | Disposition: A | Discharge status: Home / Self Care | Payer: Medicare Other

## 2018-09-05 ENCOUNTER — Encounter (HOSPITAL_COMMUNITY): Payer: Self-pay | Admitting: Emergency Medicine

## 2018-09-05 DIAGNOSIS — Z4802 Encounter for removal of sutures: Secondary | ICD-10-CM

## 2018-09-05 DIAGNOSIS — S0181XA Laceration without foreign body of other part of head, initial encounter: Secondary | ICD-10-CM

## 2018-09-05 NOTE — ED Notes (Signed)
Four sutures removed from forehead. Wound edges well approximated. No signs of infection

## 2018-09-05 NOTE — ED Triage Notes (Signed)
Here for suture removal

## 2018-12-21 DIAGNOSIS — R531 Weakness: Secondary | ICD-10-CM | POA: Diagnosis not present

## 2018-12-21 DIAGNOSIS — I10 Essential (primary) hypertension: Secondary | ICD-10-CM | POA: Diagnosis not present

## 2018-12-21 DIAGNOSIS — R269 Unspecified abnormalities of gait and mobility: Secondary | ICD-10-CM | POA: Diagnosis not present

## 2018-12-21 DIAGNOSIS — E7800 Pure hypercholesterolemia, unspecified: Secondary | ICD-10-CM | POA: Diagnosis not present

## 2019-01-04 DIAGNOSIS — R946 Abnormal results of thyroid function studies: Secondary | ICD-10-CM | POA: Diagnosis not present

## 2019-01-04 DIAGNOSIS — R630 Anorexia: Secondary | ICD-10-CM | POA: Diagnosis not present

## 2019-01-04 DIAGNOSIS — R634 Abnormal weight loss: Secondary | ICD-10-CM | POA: Diagnosis not present

## 2019-01-04 DIAGNOSIS — I10 Essential (primary) hypertension: Secondary | ICD-10-CM | POA: Diagnosis not present

## 2019-05-29 ENCOUNTER — Emergency Department (HOSPITAL_COMMUNITY): Payer: Medicare Other

## 2019-05-29 ENCOUNTER — Emergency Department (HOSPITAL_COMMUNITY)
Admission: EM | Admit: 2019-05-29 | Discharge: 2019-05-29 | Disposition: A | Discharge status: Home / Self Care | Payer: Medicare Other | Attending: Emergency Medicine | Admitting: Emergency Medicine

## 2019-05-29 ENCOUNTER — Other Ambulatory Visit: Payer: Self-pay

## 2019-05-29 ENCOUNTER — Encounter (HOSPITAL_COMMUNITY): Payer: Self-pay | Admitting: Emergency Medicine

## 2019-05-29 DIAGNOSIS — R1032 Left lower quadrant pain: Secondary | ICD-10-CM | POA: Diagnosis present

## 2019-05-29 DIAGNOSIS — Z7982 Long term (current) use of aspirin: Secondary | ICD-10-CM | POA: Diagnosis not present

## 2019-05-29 DIAGNOSIS — N400 Enlarged prostate without lower urinary tract symptoms: Secondary | ICD-10-CM | POA: Diagnosis not present

## 2019-05-29 DIAGNOSIS — Z79899 Other long term (current) drug therapy: Secondary | ICD-10-CM | POA: Insufficient documentation

## 2019-05-29 DIAGNOSIS — I10 Essential (primary) hypertension: Secondary | ICD-10-CM | POA: Diagnosis not present

## 2019-05-29 DIAGNOSIS — K4090 Unilateral inguinal hernia, without obstruction or gangrene, not specified as recurrent: Secondary | ICD-10-CM

## 2019-05-29 DIAGNOSIS — R590 Localized enlarged lymph nodes: Secondary | ICD-10-CM | POA: Diagnosis not present

## 2019-05-29 DIAGNOSIS — R1030 Lower abdominal pain, unspecified: Secondary | ICD-10-CM | POA: Diagnosis not present

## 2019-05-29 DIAGNOSIS — N4289 Other specified disorders of prostate: Secondary | ICD-10-CM

## 2019-05-29 LAB — URINALYSIS, ROUTINE W REFLEX MICROSCOPIC
Bilirubin Urine: NEGATIVE
Glucose, UA: NEGATIVE mg/dL
Hgb urine dipstick: NEGATIVE
Ketones, ur: NEGATIVE mg/dL
Leukocytes,Ua: NEGATIVE
Nitrite: NEGATIVE
Protein, ur: NEGATIVE mg/dL
Specific Gravity, Urine: 1.035 — ABNORMAL HIGH (ref 1.005–1.030)
pH: 6 (ref 5.0–8.0)

## 2019-05-29 LAB — CBC WITH DIFFERENTIAL/PLATELET
Abs Immature Granulocytes: 0 10*3/uL (ref 0.00–0.07)
Basophils Absolute: 0 10*3/uL (ref 0.0–0.1)
Basophils Relative: 1 %
Eosinophils Absolute: 0.1 10*3/uL (ref 0.0–0.5)
Eosinophils Relative: 2 %
HCT: 36 % — ABNORMAL LOW (ref 39.0–52.0)
Hemoglobin: 11.5 g/dL — ABNORMAL LOW (ref 13.0–17.0)
Immature Granulocytes: 0 %
Lymphocytes Relative: 31 %
Lymphs Abs: 0.9 10*3/uL (ref 0.7–4.0)
MCH: 32.9 pg (ref 26.0–34.0)
MCHC: 31.9 g/dL (ref 30.0–36.0)
MCV: 102.9 fL — ABNORMAL HIGH (ref 80.0–100.0)
Monocytes Absolute: 0.6 10*3/uL (ref 0.1–1.0)
Monocytes Relative: 21 %
Neutro Abs: 1.4 10*3/uL — ABNORMAL LOW (ref 1.7–7.7)
Neutrophils Relative %: 45 %
Platelets: 284 10*3/uL (ref 150–400)
RBC: 3.5 MIL/uL — ABNORMAL LOW (ref 4.22–5.81)
RDW: 12.4 % (ref 11.5–15.5)
WBC: 3 10*3/uL — ABNORMAL LOW (ref 4.0–10.5)
nRBC: 0 % (ref 0.0–0.2)

## 2019-05-29 LAB — COMPREHENSIVE METABOLIC PANEL
ALT: 16 U/L (ref 0–44)
AST: 31 U/L (ref 15–41)
Albumin: 3.4 g/dL — ABNORMAL LOW (ref 3.5–5.0)
Alkaline Phosphatase: 74 U/L (ref 38–126)
Anion gap: 7 (ref 5–15)
BUN: 12 mg/dL (ref 8–23)
CO2: 26 mmol/L (ref 22–32)
Calcium: 8.9 mg/dL (ref 8.9–10.3)
Chloride: 107 mmol/L (ref 98–111)
Creatinine, Ser: 0.95 mg/dL (ref 0.61–1.24)
GFR calc Af Amer: >60 mL/min (ref >60)
GFR calc non Af Amer: >60 mL/min (ref >60)
Glucose, Bld: 106 mg/dL — ABNORMAL HIGH (ref 70–99)
Potassium: 4.3 mmol/L (ref 3.5–5.1)
Sodium: 140 mmol/L (ref 135–145)
Total Bilirubin: 1.1 mg/dL (ref 0.3–1.2)
Total Protein: 7.3 g/dL (ref 6.5–8.1)

## 2019-05-29 LAB — PSA: Prostatic Specific Antigen: 126.49 ng/mL — ABNORMAL HIGH (ref 0.00–4.00)

## 2019-05-29 MED ORDER — IOHEXOL 300 MG/ML  SOLN
100.0000 mL | Freq: Once | INTRAMUSCULAR | Status: AC | PRN
  Administered 2019-05-29: 100 mL via INTRAVENOUS

## 2019-05-29 NOTE — ED Notes (Addendum)
Patient verbalizes understanding of discharge instructions. Opportunity for questioning and answers were provided. Armband removed by staff, pt discharged from ED.    Patty RN escorted pt to short stay entrance

## 2019-05-29 NOTE — ED Triage Notes (Addendum)
Pt here from home with c/o left sided groin pain. States he had surgery for hernia repair several years ago in that area.

## 2019-05-29 NOTE — Discharge Instructions (Addendum)
I am concerned about a prostate mass which could possibly be prostate cancer.  You will need to see a urologist in the [facility] system at [place], [place].  Call for an appointment.  You could also follow-up with the local urologist.  Phone number given in your discharge instructions.

## 2019-05-29 NOTE — ED Notes (Addendum)
Attempted to reach pt contact a second time  Phone number provided by patient [phone] (home)  (Ms. Massie Maroon- Caregiver) Cell [phone]

## 2019-05-29 NOTE — ED Provider Notes (Addendum)
[facility] EMERGENCY DEPARTMENT Provider Note   CSN: [id number] Arrival date & time: 05/29/19  1158    History   Chief Complaint Chief Complaint  Patient presents with   Hernia    HPI Frank [name] is a 85 y.o. male.     Left inguinal pain for multiple months, getting worse.  Status post left inguinal hernia repair many years ago.  Patient has been eating without vomiting or diarrhea, but he is lost 20 pounds.  No fever, sweats, chills, dysuria, hematuria.  He is seeing a doctor at the [facility], but no Garment/textile technologist.     Past Medical History:  Diagnosis Date   High cholesterol    Hypertension     There are no active problems to display for this patient.   Past Surgical History:  Procedure Laterality Date   BACK SURGERY     HERNIA REPAIR     JOINT REPLACEMENT          Home Medications    Prior to Admission medications   Medication Sig Start Date End Date Taking? Authorizing Provider  aspirin EC 81 MG tablet Take 81 mg by mouth daily.    [provider]  atenolol (TENORMIN) 50 MG tablet Take 25 mg by mouth daily.    [provider]  hydrochlorothiazide (HYDRODIURIL) 25 MG tablet Take 12.5 mg by mouth daily.    [provider]  omeprazole (PRILOSEC) 20 MG capsule Take 20 mg by mouth daily.    [provider]  pravastatin (PRAVACHOL) 40 MG tablet Take 40 mg by mouth every evening.    [provider]  vitamin B-12 (CYANOCOBALAMIN) 100 MCG tablet Take 100 mcg by mouth daily.    [provider]    Family History History reviewed. No pertinent family history.  Social History Social History   Tobacco Use   Smoking status: Never Smoker  Substance Use Topics   Alcohol use: Yes    Comment: drinks a thimble of brandy with coffee every morning   Drug use: No     Allergies   Patient has no known allergies.   Review of Systems Review of Systems  All other systems reviewed and are  negative.    Physical Exam Updated Vital Signs BP (!) 166/78 (BP Location: Left Arm)    Pulse (!) 54    Temp 98.6 F (37 C) (Oral)    Resp 18    SpO2 97%   Physical Exam Vitals signs and nursing note reviewed.  Constitutional:      Appearance: He is well-developed.  HENT:     Head: Normocephalic and atraumatic.  Eyes:     Conjunctiva/sclera: Conjunctivae normal.  Neck:     Musculoskeletal: Neck supple.  Cardiovascular:     Rate and Rhythm: Normal rate and regular rhythm.  Pulmonary:     Effort: Pulmonary effort is normal.     Breath sounds: Normal breath sounds.  Abdominal:     General: Bowel sounds are normal.     Palpations: Abdomen is soft.     Comments: Left inguinal hernia; no masses  Genitourinary:    Comments: Rectal exam: Large mass in the area of the left prostate.  No black stool.  Heme-negative. Musculoskeletal: Normal range of motion.  Skin:    General: Skin is warm and dry.  Neurological:     Mental Status: He is alert and oriented to person, place, and time.  Psychiatric:  Behavior: Behavior normal.      ED Treatments / Results  Labs (all labs ordered are listed, but only abnormal results are displayed) Labs Reviewed  CBC WITH DIFFERENTIAL/PLATELET - Abnormal; Notable for the following components:      Result Value   WBC 3.0 (*)    RBC 3.50 (*)    Hemoglobin 11.5 (*)    HCT 36.0 (*)    MCV 102.9 (*)    Neutro Abs 1.4 (*)    All other components within normal limits  COMPREHENSIVE METABOLIC PANEL - Abnormal; Notable for the following components:   Glucose, Bld 106 (*)    Albumin 3.4 (*)    All other components within normal limits  URINALYSIS, ROUTINE W REFLEX MICROSCOPIC - Abnormal; Notable for the following components:   Specific Gravity, Urine 1.035 (*)    All other components within normal limits  PSA  POC OCCULT BLOOD, ED    EKG None  Radiology Ct Abdomen Pelvis W Contrast  Result Date: 05/29/2019 CLINICAL DATA:   Left-sided groin pain EXAM: CT ABDOMEN AND PELVIS WITH CONTRAST TECHNIQUE: Multidetector CT imaging of the abdomen and pelvis was performed using the standard protocol following bolus administration of intravenous contrast. CONTRAST:  122mL OMNIPAQUE IOHEXOL 300 MG/ML  SOLN COMPARISON:  None. FINDINGS: Lower chest: No acute abnormality. Hepatobiliary: No solid liver abnormality is seen. No gallstones, gallbladder wall thickening, or biliary dilatation. Pancreas: Unremarkable. No pancreatic ductal dilatation or surrounding inflammatory changes. Spleen: Normal in size without significant abnormality. Adrenals/Urinary Tract: Adrenal glands are unremarkable. Kidneys are normal, without renal calculi, solid lesion, or hydronephrosis. Bladder is unremarkable. Stomach/Bowel: Stomach is within normal limits. Appendix appears normal. No evidence of bowel wall thickening, distention, or inflammatory changes. Severe sigmoid diverticulosis. Vascular/Lymphatic: Aortic atherosclerosis. Prominent left pelvic sidewall lymph nodes measuring up to 8 mm (series 3, image 57). Reproductive: There is a large, hypodense mass which appears to arise from the posterior left aspect of the prostate, measuring at least 6.5 x 6.4 x 5.5 cm (series 3, image 67, series 6, image 110). Other: Evidence of prior left inguinal hernia repair without evidence of recurrent hernia. No abdominopelvic ascites. Musculoskeletal: No acute or significant osseous findings. IMPRESSION: 1. There is a large, hypodense mass which appears to arise from the posterior left aspect of the prostate, measuring at least 6.5 x 6.4 x 5.5 cm (series 3, image 67, series 6, image 110). This is suspicious for prostatic malignancy or alternately abscess. Rectal mass is a differential consideration. 2. Prominent left pelvic sidewall lymph nodes measuring up to 8 mm (series 3, image 57). 3.  Other chronic and incidental findings as detailed above. Electronically Signed   By: Eddie Candle M.D.   On: 05/29/2019 15:47    Procedures Procedures (including critical care time)  Medications Ordered in ED Medications  iohexol (OMNIPAQUE) 300 MG/ML solution 100 mL (100 mLs Intravenous Contrast Given 05/29/19 1526)     Initial Impression / Assessment and Plan / ED Course  I have reviewed the triage vital signs and the nursing notes.  Pertinent labs & imaging results that were available during my care of the patient were reviewed by me and considered in my medical decision making (see chart for details).        Patient presents with probable left inguinal hernia.  Will obtain CT scan of abdomen pelvis because of his age and longevity of symptoms.  1700: Discussed findings with urologist on-call Dr. Alinda Money.  Also discussed the possibility of  prostate cancer with the patient.  He understands the ramifications of this diagnosis.  He will follow-up with either the local urologist or the urologist at the [facility] center.  Patient is stable for outpatient work-up.  Final Clinical Impressions(s) / ED Diagnoses   Final diagnoses:  Left inguinal hernia  Prostate mass    ED Discharge Orders    None       Nat Christen, MD 05/29/19 1312    Nat Christen, MD 05/29/19 1754

## 2019-05-29 NOTE — ED Notes (Signed)
Spoke with pt contact Gave update, answered questions.

## 2019-09-05 DIAGNOSIS — C3411 Malignant neoplasm of upper lobe, right bronchus or lung: Secondary | ICD-10-CM | POA: Insufficient documentation

## 2019-09-14 ENCOUNTER — Telehealth: Payer: Self-pay | Admitting: *Deleted

## 2019-09-14 NOTE — Telephone Encounter (Signed)
Made several attempts to reach out to the patient and his significant other. Unable to leave a message with both parties.

## 2019-09-19 ENCOUNTER — Telehealth: Payer: Self-pay | Admitting: *Deleted

## 2019-09-19 NOTE — Telephone Encounter (Signed)
Called again and voicemail was working for both parties, the patient and the significant other, Left messages for a callback to schedule appointment.

## 2019-09-20 ENCOUNTER — Ambulatory Visit
Admission: RE | Admit: 2019-09-20 | Discharge: 2019-09-20 | Disposition: A | Discharge status: Home / Self Care | Payer: Self-pay | Source: Ambulatory Visit | Attending: Radiation Oncology | Admitting: Radiation Oncology

## 2019-09-20 ENCOUNTER — Other Ambulatory Visit: Payer: Self-pay | Admitting: Radiation Oncology

## 2019-09-20 DIAGNOSIS — C3490 Malignant neoplasm of unspecified part of unspecified bronchus or lung: Secondary | ICD-10-CM

## 2019-09-20 NOTE — Progress Notes (Signed)
Thoracic Location of Tumor / Histology: newly diagnosed at least stage III NSCLC.    Biopsies revealed: Lung cancer ([place])  07/17/2019 Biopsy  Prostate biopsy Gleason 4+3 in 1 of 2 cores Involving 65% of prostate tissue Staged as cT4 (fixed to side wall) N1 Stage IVA group 5, psa 177.0 on 07/25/19 Managed w ADT w Dr Archie Balboa at [facility] Degarelix initiated 07/25/19  07/25/2019 - Imaging  CT Chest abdomen pelvis  2.9 x 3.2 cm cavitary RUL tumor with bulking mediastinal adenopathy at level 4R Large heterogeneous pelvic mass 8.5 x 7.5 cm and small left pelvis side wall lymph node  08/15/2019 Biopsy  Right lung FNA - malignant cells Not enough tissue for origin dx   08/23/2019 Biopsy  EBUS TBNA LN 7 biopsied w four passes - no malignancy LN4R - non small cell carcinoma w pleomorphic features Inconclusive immunostains origin cannot be definitively determined   Tobacco/Marijuana/Snuff/ETOH use: Smoking Says he quit about 15 years ago Could not provide ppd or how long he smoked  Just said for years and years.    Past/Anticipated interventions by medical oncology, if any: [facility].   Additionally he has a large prostate mass ~ 8-9 cm with pelvic side wall extension and pelvic LN enlargement. Prostate biopsy at the [facility] demonstrated Gleason 4+3, disease and PSA in August was 177. He is on degarelix with Dr Archie Balboa at the [facility] and tolerating it well.    Signs/Symptoms Weight changes, if any:  Wt Readings from Last 3 Encounters:  09/21/19 146 lb 6.4 oz (66.4 kg)  08/29/18 150 lb (68 kg)  08/01/15 140 lb (63.5 kg)     Pt denies cough. Pt denies hemoptysis. Denies SOB.  Denies difficulty breathing or eating, denies hemoptysis.   Pain issues, if any: Pt reports chronic joint and back pain, otherwise no acute pain.  SAFETY ISSUES:  Prior radiation? No  Pacemaker/ICD? No   Possible current pregnancy? N/A  Is the patient on methotrexate? No  Current Complaints / other  details:  Pt presents today for initial consult with Dr. Sondra Come for Radiation Oncology.   BP 121/74 (BP Location: Right Arm, Patient Position: Sitting)   Pulse 70   Temp 98.3 F (36.8 C) (Temporal)   Resp 18   Ht 5\' 11"  (1.803 m)   Wt 146 lb 6.4 oz (66.4 kg)   SpO2 98%   BMI 20.42 kg/m   Loma Sousa, RN BSN

## 2019-09-21 ENCOUNTER — Other Ambulatory Visit: Payer: Self-pay

## 2019-09-21 ENCOUNTER — Ambulatory Visit
Admission: RE | Admit: 2019-09-21 | Discharge: 2019-09-21 | Disposition: A | Discharge status: Home / Self Care | Payer: Medicare Other | Source: Ambulatory Visit | Attending: Radiation Oncology | Admitting: Radiation Oncology

## 2019-09-21 ENCOUNTER — Encounter: Payer: Self-pay | Admitting: Radiation Oncology

## 2019-09-21 VITALS — BP 121/74 | HR 70 | Temp 98.3°F | Resp 18 | Ht 71.0 in | Wt 146.4 lb

## 2019-09-21 DIAGNOSIS — Z7982 Long term (current) use of aspirin: Secondary | ICD-10-CM | POA: Insufficient documentation

## 2019-09-21 DIAGNOSIS — E7800 Pure hypercholesterolemia, unspecified: Secondary | ICD-10-CM | POA: Insufficient documentation

## 2019-09-21 DIAGNOSIS — Z79899 Other long term (current) drug therapy: Secondary | ICD-10-CM | POA: Insufficient documentation

## 2019-09-21 DIAGNOSIS — R590 Localized enlarged lymph nodes: Secondary | ICD-10-CM | POA: Insufficient documentation

## 2019-09-21 DIAGNOSIS — C3411 Malignant neoplasm of upper lobe, right bronchus or lung: Secondary | ICD-10-CM

## 2019-09-21 DIAGNOSIS — R222 Localized swelling, mass and lump, trunk: Secondary | ICD-10-CM | POA: Insufficient documentation

## 2019-09-21 DIAGNOSIS — R911 Solitary pulmonary nodule: Secondary | ICD-10-CM | POA: Insufficient documentation

## 2019-09-21 DIAGNOSIS — R1900 Intra-abdominal and pelvic swelling, mass and lump, unspecified site: Secondary | ICD-10-CM | POA: Insufficient documentation

## 2019-09-21 DIAGNOSIS — C61 Malignant neoplasm of prostate: Secondary | ICD-10-CM | POA: Diagnosis not present

## 2019-09-21 DIAGNOSIS — I10 Essential (primary) hypertension: Secondary | ICD-10-CM | POA: Diagnosis not present

## 2019-09-21 DIAGNOSIS — R972 Elevated prostate specific antigen [PSA]: Secondary | ICD-10-CM | POA: Diagnosis not present

## 2019-09-21 DIAGNOSIS — R221 Localized swelling, mass and lump, neck: Secondary | ICD-10-CM | POA: Diagnosis not present

## 2019-09-21 DIAGNOSIS — C3490 Malignant neoplasm of unspecified part of unspecified bronchus or lung: Secondary | ICD-10-CM

## 2019-09-21 NOTE — Progress Notes (Signed)
Radiation Oncology         [phone] ________________________________  Initial Outpatient Consultation  Name: Frank [name] MRN: [id number]  Date: 09/21/2019  DOB: July 27, 1928  XL:[id number], Jori Moll, MD  Waynetta Sandy, *   REFERRING PHYSICIAN: Waynetta Sandy, *  DIAGNOSIS: The encounter diagnosis was Primary cancer of right upper lobe of lung ([place]).  At least stage IIIA T2a, N2 NSCLC w pleomorphic features, pending brain MRI  HISTORY OF PRESENT ILLNESS::Frank [name] is a 85 y.o. male who is accompanied by no one due to COVID-19 restrictions. The patient is followed by his doctors at the [place]. He presented with groin pain and underwent abdomen/pelvis CT on 05/22/2019, which revealed: a large indistinct heterogeneously enhancing pelvic mass involving the prostate, possibly left internal obturator muscle, and the lower rectum/anal canal without clear bladder invasion; nonspecific small left internal lymph nodes; nonspecific 5 mm nodule within the LLL; indeterminate enhancing liver lesions. PSA performed the following day was significantly elevated at 126.4. He also presented to the ED on 05/29/2019 with worsening left inguinal pain and underwent another abdomen/pelvis CT.   He proceeded to prostate biopsy at the [place] on 07/17/2019, which confirmed Gleason 4+5 disease. He is being treated with ADT under the care of Dr. Archie Balboa at the [facility].  He underwent staging scans on 07/25/2019. Chest/abdomen/pelvis CT revealed: 3.2 cm cavitary mass in the RUL with mediastinal adenopathy; indeterminate 4 mm nodule in RUL; 8.5 cm pelvic mass with a small left pelvic sidewall lymph node; nonspecific 3.2 mm hypodensity within the right kidney. Bone scan did not show evidence of metastatic disease. Repeat PSA performed that day was further elevated to 177.   The patient underwent right lung biopsy on 08/15/2019. Pathology showed malignant cells, but there was insufficient tissue for an origin diagnosis. He  proceeded to EBUS on 08/23/2019 of a lymph node showing: non-small cell carcinoma with pleomorphic features. Immunostains were inconclusive due to too few malignant cells.  He was referred to Dr. Lurline Hare in radiation oncology at [facility] on 09/05/2019, who recommended staging brain MRI and PET scan. He also recommended 6 weeks of radiation therapy if there are no additional metastases seen on either staging scan.  PET scan performed on 09/14/2019.  The scan showed hypermetabolic activity in the cavitary posterior right apical/upper lobe mass consistent with primary bronchogenic carcinoma.  There was also right paratracheal, subcarinal and paraesophageal lymph nodes which showed activity as well as a right supraclavicular lymph node.  In addition some activity was noted in the bilateral hilar right prevascular and AP window nodes but this was not definite.  There was no activity noted in the mid neck region.  PREVIOUS RADIATION THERAPY: No  PAST MEDICAL HISTORY:  Past Medical History:  Diagnosis Date   High cholesterol    Hypertension     PAST SURGICAL HISTORY: Past Surgical History:  Procedure Laterality Date   BACK SURGERY     HERNIA REPAIR     JOINT REPLACEMENT      FAMILY HISTORY: No family history on file.  SOCIAL HISTORY:  Social History   Tobacco Use   Smoking status: Never Smoker  Substance Use Topics   Alcohol use: Yes    Comment: drinks a thimble of brandy with coffee every morning   Drug use: No    ALLERGIES: No Known Allergies  MEDICATIONS:  Current Outpatient Medications  Medication Sig Dispense Refill   aspirin EC 81 MG tablet Take 81 mg by mouth daily.  atenolol (TENORMIN) 50 MG tablet Take 25 mg by mouth daily.     degarelix (FIRMAGON) 120 mg injection INJECT 240MG  SUBCUTANEOUSLY ONCE     docusate sodium (COLACE) 100 MG capsule TAKE 2 CAPSULES BY MOUTH TWICE A DAY     hydrochlorothiazide (HYDRODIURIL) 25 MG tablet Take 12.5 mg by mouth daily.      Omega-3 1000 MG CAPS Take by mouth.     omeprazole (PRILOSEC) 20 MG capsule Take 20 mg by mouth daily.     pravastatin (PRAVACHOL) 40 MG tablet Take 40 mg by mouth every evening.     vitamin B-12 (CYANOCOBALAMIN) 100 MCG tablet Take 100 mcg by mouth daily.     No current facility-administered medications for this encounter.     REVIEW OF SYSTEMS:  A 10+ POINT REVIEW OF SYSTEMS WAS OBTAINED including neurology, dermatology, psychiatry, cardiac, respiratory, lymph, extremities, GI, GU, musculoskeletal, constitutional, reproductive, HEENT. He denies cough, hemoptysis, shortness of breath, and difficulty breathing or eating. He reports chronic joint and back pain.  He reports no headaches.  Patient admits to poor appetite over the past 3 years.   PHYSICAL EXAM:  height is 5\' 11"  (1.803 m) and weight is 146 lb 6.4 oz (66.4 kg). His temporal temperature is 98.3 F (36.8 C). His blood pressure is 121/74 and his pulse is 70. His respiration is 18 and oxygen saturation is 98%.  Body mass index is 20.42 kg/m. General: Alert and oriented, in no acute distress, he is able to ambulate around the examination room without assistance HEENT: Head is normocephalic. Extraocular movements are intact. Oropharynx is clear. Neck: Neck is supple, no palpable cervical or supraclavicular lymphadenopathy. Heart: Regular in rate and rhythm with no murmurs, rubs, or gallops. Chest: Clear to auscultation bilaterally, with no rhonchi, wheezes, or rales. Abdomen: Soft, nontender, nondistended, with no rigidity or guarding. Extremities: No cyanosis or edema. Lymphatics: see Neck Exam Skin: No concerning lesions. Musculoskeletal: symmetric strength and muscle tone throughout. Neurologic: Cranial nerves II through XII are grossly intact. No obvious focalities. Speech is fluent. Coordination is intact. Psychiatric: Judgment and insight are intact. Affect is appropriate.   ECOG = 1  0 - Asymptomatic (Fully active, able  to carry on all predisease activities without restriction)  1 - Symptomatic but completely ambulatory (Restricted in physically strenuous activity but ambulatory and able to carry out work of a light or sedentary nature. For example, light housework, office work)  2 - Symptomatic, <50% in bed during the day (Ambulatory and capable of all self care but unable to carry out any work activities. Up and about more than 50% of waking hours)  3 - Symptomatic, >50% in bed, but not bedbound (Capable of only limited self-care, confined to bed or chair 50% or more of waking hours)  4 - Bedbound (Completely disabled. Cannot carry on any self-care. Totally confined to bed or chair)  5 - Death   Eustace Pen MM, Creech RH, Tormey DC, et al. [phone]). "Toxicity and response criteria of the [facility]". [place] Oncol. 5 (6): 649-55  LABORATORY DATA:  Lab Results  Component Value Date   WBC 3.0 (L) 05/29/2019   HGB 11.5 (L) 05/29/2019   HCT 36.0 (L) 05/29/2019   MCV 102.9 (H) 05/29/2019   PLT 284 05/29/2019   NEUTROABS 1.4 (L) 05/29/2019   Lab Results  Component Value Date   NA 140 05/29/2019   K 4.3 05/29/2019   CL 107 05/29/2019   CO2 26 05/29/2019  GLUCOSE 106 (H) 05/29/2019   CREATININE 0.95 05/29/2019   CALCIUM 8.9 05/29/2019      RADIOGRAPHY: No results found.                                                IMPRESSION: At least stage IIIA T2a, N2 NSCLC w pleomorphic features, pending brain MRI  Patient would be a good candidate for definitive course of radiation therapy approximately 6 weeks.  Patient may also be a candidate for radiosensitizing chemotherapy and will get patient set up to see [name] in the near future.  On my examination today the patient has a palpable left anterior mid neck node measuring approximately 1 cm but  this did not show any activity on patient's recent PET scan.  We will monitor this area during the course of his treatment and consider  biopsy if  enlargement.  The patient denies any head neck symptoms.  His oropharynx is free of any suspicious lesions on exam today.  Today, I talked to the patient and about the findings and work-up thus far.  We discussed the natural history of non-small cell lung cancer and general treatment, highlighting the role of radiotherapy in the management.  We discussed the available radiation techniques, and focused on the details of logistics and delivery.  We reviewed the anticipated acute and late sequelae associated with radiation in this setting.  The patient was encouraged to ask questions that I answered to the best of my ability.  A patient consent form was discussed and signed.  We retained a copy for our records.  The patient would like to proceed with radiation and will be scheduled for CT simulation.  PLAN: Patient will be scheduled for CT simulation early next week with treatments to begin a few days later.  He will be scheduled for medical oncology consultation with [name].  It is not appear the patient was scheduled for his brain MRI and will order this study to complete his staging work-up.  Patient will continue on ADT through the [place] for the patient's advanced prostate cancer.    ------------------------------------------------  Blair Promise, PhD, MD  This document serves as a record of services personally performed by Gery Pray, MD. It was created on his behalf by Wilburn Mylar, a trained medical scribe. The creation of this record is based on the scribe's personal observations and the provider's statements to them. This document has been checked and approved by the attending provider.

## 2019-09-21 NOTE — Patient Instructions (Signed)
Coronavirus (COVID-19) Are you at risk?  Are you at risk for the Coronavirus (COVID-19)?  To be considered HIGH RISK for Coronavirus (COVID-19), you have to meet the following criteria:  . Traveled to [place], [place], [place], [place] or [place]; or in the [place] to [place], [place], [place], or [place]; and have fever, cough, and shortness of breath within the last 2 weeks of travel OR . Been in close contact with a person diagnosed with COVID-19 within the last 2 weeks and have fever, cough, and shortness of breath . IF YOU DO NOT MEET THESE CRITERIA, YOU ARE CONSIDERED LOW RISK FOR COVID-19.  What to do if you are HIGH RISK for COVID-19?  . If you are having a medical emergency, call 911. . Seek medical care right away. Before you go to a doctor's office, urgent care or emergency department, call ahead and tell them about your recent travel, contact with someone diagnosed with COVID-19, and your symptoms. You should receive instructions from your physician's office regarding next steps of care.  . When you arrive at healthcare provider, tell the healthcare staff immediately you have returned from visiting [place], [place], [place], [place] or [place]; or traveled in the [place] to [place], [place], [place], or [place]; in the last two weeks or you have been in close contact with a person diagnosed with COVID-19 in the last 2 weeks.   . Tell the health care staff about your symptoms: fever, cough and shortness of breath. . After you have been seen by a medical provider, you will be either: o Tested for (COVID-19) and discharged home on quarantine except to seek medical care if symptoms worsen, and asked to  - Stay home and avoid contact with others until you get your results (4-5 days)  - Avoid travel on public transportation if possible (such as bus, train, or airplane) or o Sent to the Emergency Department by EMS for evaluation, COVID-19 testing, and possible  admission depending on your condition and test results.  What to do if you are LOW RISK for COVID-19?  Reduce your risk of any infection by using the same precautions used for avoiding the common cold or flu:  . Wash your hands often with soap and warm water for at least 20 seconds.  If soap and water are not readily available, use an alcohol-based hand sanitizer with at least 60% alcohol.  . If coughing or sneezing, cover your mouth and nose by coughing or sneezing into the elbow areas of your shirt or coat, into a tissue or into your sleeve (not your hands). . Avoid shaking hands with others and consider head nods or verbal greetings only. . Avoid touching your eyes, nose, or mouth with unwashed hands.  . Avoid close contact with people who are sick. . Avoid places or events with large numbers of people in one location, like concerts or sporting events. . Carefully consider travel plans you have or are making. . If you are planning any travel outside or inside the [place], visit the CDC's Travelers' Health webpage for the latest health notices. . If you have some symptoms but not all symptoms, continue to monitor at home and seek medical attention if your symptoms worsen. . If you are having a medical emergency, call 911.   ADDITIONAL HEALTHCARE OPTIONS FOR PATIENTS  [place] [facility] / e-Visit: [email or web address]         [facility]: [phone]  [facility]: [phone]                   [facility]: [phone]   

## 2019-09-22 ENCOUNTER — Telehealth: Payer: Self-pay | Admitting: Internal Medicine

## 2019-09-22 ENCOUNTER — Encounter: Payer: Self-pay | Admitting: *Deleted

## 2019-09-22 DIAGNOSIS — C3411 Malignant neoplasm of upper lobe, right bronchus or lung: Secondary | ICD-10-CM

## 2019-09-22 NOTE — Progress Notes (Signed)
Oncology Nurse Navigator Documentation  Oncology Nurse Navigator Flowsheets 09/22/2019  Navigator Location [facility]  Referral Date to RadOnc/MedOnc 09/22/2019  Navigator Encounter Type Other/I received referral on Mr. Chavers today.  I asked new patient coordinator to call and schedule an appt with [name] on 10/29  Barriers/Navigation Needs Coordination of Care  Interventions Coordination of Care  Coordination of Care Other  Acuity Level 2-Minimal Needs (1-2 Barriers Identified)  Time Spent with Patient 30

## 2019-09-22 NOTE — Telephone Encounter (Signed)
A new patient appt has been scheduled for the pt to see [name] on 10/29 at 2pm w/labs at 1:30pm. I was unable to reach the pt on both phone numbers in his demographics. I updated the thoracic navigator.

## 2019-09-25 ENCOUNTER — Ambulatory Visit: Payer: No Typology Code available for payment source | Admitting: Radiation Oncology

## 2019-09-26 ENCOUNTER — Other Ambulatory Visit: Payer: Self-pay | Admitting: Radiation Oncology

## 2019-09-26 ENCOUNTER — Ambulatory Visit
Admission: RE | Admit: 2019-09-26 | Discharge: 2019-09-26 | Disposition: A | Discharge status: Home / Self Care | Payer: Self-pay | Source: Ambulatory Visit | Attending: Radiation Oncology | Admitting: Radiation Oncology

## 2019-09-26 DIAGNOSIS — C3490 Malignant neoplasm of unspecified part of unspecified bronchus or lung: Secondary | ICD-10-CM

## 2019-09-28 ENCOUNTER — Inpatient Hospital Stay: Payer: Medicare Other

## 2019-09-28 ENCOUNTER — Inpatient Hospital Stay: Payer: Medicare Other | Attending: Internal Medicine | Admitting: Internal Medicine

## 2019-09-29 ENCOUNTER — Ambulatory Visit
Admission: RE | Admit: 2019-09-29 | Discharge: 2019-09-29 | Disposition: A | Discharge status: Home / Self Care | Payer: No Typology Code available for payment source | Source: Ambulatory Visit | Attending: Radiation Oncology | Admitting: Radiation Oncology

## 2019-09-29 ENCOUNTER — Other Ambulatory Visit: Payer: Self-pay

## 2019-09-29 ENCOUNTER — Other Ambulatory Visit (HOSPITAL_COMMUNITY): Payer: Self-pay | Admitting: Hematology & Oncology

## 2019-09-29 DIAGNOSIS — C3411 Malignant neoplasm of upper lobe, right bronchus or lung: Secondary | ICD-10-CM | POA: Diagnosis not present

## 2019-10-06 ENCOUNTER — Encounter: Payer: Self-pay | Admitting: *Deleted

## 2019-10-06 NOTE — Progress Notes (Signed)
Oncology Nurse Navigator Documentation  Oncology Nurse Navigator Flowsheets 10/06/2019  Navigator Location [facility]  Referral Date to RadOnc/MedOnc -  Navigator Encounter Type Other/I followed up on Mr. Kellenberger and his appt.  I am unclear if he came to his scheduled appt with [name]. I reached out to new patient coordinator for clarification.   Barriers/Navigation Needs Coordination of Care  Interventions Coordination of Care  Acuity Level 2-Minimal Needs (1-2 Barriers Identified)  Coordination of Care Other  Time Spent with Patient 15

## 2019-10-10 ENCOUNTER — Encounter: Payer: Self-pay | Admitting: *Deleted

## 2019-10-10 ENCOUNTER — Other Ambulatory Visit (HOSPITAL_COMMUNITY): Payer: Self-pay | Admitting: Hematology & Oncology

## 2019-10-10 ENCOUNTER — Telehealth: Payer: Self-pay | Admitting: *Deleted

## 2019-10-10 NOTE — Progress Notes (Signed)
Oncology Nurse Navigator Documentation  Oncology Nurse Navigator Flowsheets 10/10/2019  Navigator Location [facility]  Referral Date to RadOnc/MedOnc -  Navigator Encounter Type Telephone/I spoke to [place] with the [place] regarding Mr. Linquist.  We have been trying to schedule him but no answer.  She states he is difficult to get.  She states she will reach out to him again and she is going to give him my phone number and name to call.  I will update Rad Onc   Treatment Phase Abnormal Scans  Barriers/Navigation Needs Coordination of Care;Education  Education Other  Interventions Coordination of Care;Education  Acuity Level 3-Moderate Needs (3-4 Barriers Identified)  Coordination of Care Other  Time Spent with Patient 65

## 2019-10-10 NOTE — Progress Notes (Signed)
Oncology Nurse Navigator Documentation  Oncology Nurse Navigator Flowsheets 10/10/2019  Navigator Location [facility]  Referral Date to RadOnc/MedOnc -  Navigator Encounter Type Other/patient needs to be re-scheduled with [name]. I updated new patient coordinator to call and arrange an appt.   Barriers/Navigation Needs Coordination of Care  Interventions Coordination of Care  Acuity Level 2-Minimal Needs (1-2 Barriers Identified)  Coordination of Care Other  Time Spent with Patient 15

## 2019-10-10 NOTE — Telephone Encounter (Signed)
CALLED PATIENT TO ANSWER QUESTION, NO ANSWER, UNABLE TO LEAVE MESSAGE NO ANSWERING MACHINE OR VOICE MAIL

## 2019-10-10 NOTE — Telephone Encounter (Signed)
Called patient to inform of MRI for 10-11-19 - arrival time - 6:30 am @ [facility], no restrictions to test, I also informed patient and sgo- Manfred Shirts that Dr. Sondra Come would be delaying the start of his treatment, until he finds out the results of his MRI, patient and sgo verified understanding this

## 2019-10-11 ENCOUNTER — Ambulatory Visit (HOSPITAL_COMMUNITY)
Admission: RE | Admit: 2019-10-11 | Discharge: 2019-10-11 | Disposition: A | Discharge status: Home / Self Care | Payer: Medicare Other | Source: Ambulatory Visit | Attending: Radiation Oncology | Admitting: Radiation Oncology

## 2019-10-11 ENCOUNTER — Ambulatory Visit: Payer: No Typology Code available for payment source | Admitting: Radiation Oncology

## 2019-10-11 ENCOUNTER — Encounter: Payer: Self-pay | Admitting: Internal Medicine

## 2019-10-11 ENCOUNTER — Encounter: Payer: Self-pay | Admitting: *Deleted

## 2019-10-11 ENCOUNTER — Telehealth: Payer: Self-pay

## 2019-10-11 ENCOUNTER — Other Ambulatory Visit: Payer: Self-pay

## 2019-10-11 ENCOUNTER — Telehealth: Payer: Self-pay | Admitting: Internal Medicine

## 2019-10-11 DIAGNOSIS — C3490 Malignant neoplasm of unspecified part of unspecified bronchus or lung: Secondary | ICD-10-CM | POA: Diagnosis not present

## 2019-10-11 LAB — POCT I-STAT CREATININE: Creatinine, Ser: 0.8 mg/dL (ref 0.61–1.24)

## 2019-10-11 MED ORDER — GADOBUTROL 1 MMOL/ML IV SOLN
6.0000 mL | Freq: Once | INTRAVENOUS | Status: AC | PRN
  Administered 2019-10-11: 6 mL via INTRAVENOUS

## 2019-10-11 NOTE — Progress Notes (Signed)
Oncology Nurse Navigator Documentation  Oncology Nurse Navigator Flowsheets 10/11/2019  Abnormal Finding Date 05/22/2019  Confirmed Diagnosis Date 08/15/2019  Diagnosis Status Confirmed Diagnosis Complete  Planned Course of Treatment Chemo/Radiation Concurrent  Navigator Location [facility]  Referral Date to RadOnc/MedOnc -  Navigator Encounter Type Other/I followed up on Frank [name]'s schedule. He is set up with Rad Onc and a follow up with [name] on 11/23.  I will check with [name] to see if he can see him sooner.    Treatment Initiated Date 09/29/2019  Treatment Phase Pre-Tx/Tx Discussion  Barriers/Navigation Needs Coordination of Care  Education -  Interventions Coordination of Care  Acuity Level 2-Minimal Needs (1-2 Barriers Identified)  Coordination of Care Other  Time Spent with Patient 30

## 2019-10-11 NOTE — Telephone Encounter (Signed)
Contacted pt to convey good MRI brain results per Dr. Sondra Come. Pt verbalized understanding and agreement. Loma Sousa, RN BSN

## 2019-10-11 NOTE — Telephone Encounter (Signed)
A new patient appt for Frank [name] to see [name] on 11/23 at 2:15pm w/labs at 1:45pm. Letter mailed to the pt.

## 2019-10-12 ENCOUNTER — Ambulatory Visit: Payer: No Typology Code available for payment source

## 2019-10-13 ENCOUNTER — Ambulatory Visit: Payer: No Typology Code available for payment source

## 2019-10-15 ENCOUNTER — Encounter (HOSPITAL_COMMUNITY): Payer: Self-pay

## 2019-10-15 ENCOUNTER — Other Ambulatory Visit (HOSPITAL_COMMUNITY): Payer: Medicare Other

## 2019-10-16 ENCOUNTER — Encounter (HOSPITAL_COMMUNITY): Payer: Self-pay

## 2019-10-16 ENCOUNTER — Ambulatory Visit: Payer: No Typology Code available for payment source

## 2019-10-16 ENCOUNTER — Telehealth: Payer: Self-pay | Admitting: *Deleted

## 2019-10-16 ENCOUNTER — Other Ambulatory Visit (HOSPITAL_COMMUNITY): Payer: Self-pay | Admitting: Hematology & Oncology

## 2019-10-16 ENCOUNTER — Encounter: Payer: Self-pay | Admitting: *Deleted

## 2019-10-16 ENCOUNTER — Telehealth: Payer: Self-pay | Admitting: Internal Medicine

## 2019-10-16 DIAGNOSIS — C3411 Malignant neoplasm of upper lobe, right bronchus or lung: Secondary | ICD-10-CM | POA: Insufficient documentation

## 2019-10-16 NOTE — Progress Notes (Signed)
Oncology Nurse Navigator Documentation  Oncology Nurse Navigator Flowsheets 10/16/2019  Abnormal Finding Date -  Confirmed Diagnosis Date -  Diagnosis Status -  Planned Course of Treatment -  Navigator Location [facility]  Referral Date to RadOnc/MedOnc -  Navigator Encounter Type Other/[name] updated me that patient can be seen on Friday with Cassie. I updated scheduling to call and schedule in the am when [name] is here to see patient too.   Treatment Initiated Date -  Treatment Phase Pre-Tx/Tx Discussion  Barriers/Navigation Needs Coordination of Care  Education -  Interventions Coordination of Care  Acuity Level 2-Minimal Needs (1-2 Barriers Identified)  Coordination of Care Other  Time Spent with Patient 15

## 2019-10-16 NOTE — Telephone Encounter (Signed)
Called pt per 11/16 sch message - no answer/ unable to leave message. - let RN Hinton Dyer know

## 2019-10-16 NOTE — Telephone Encounter (Signed)
Oncology Nurse Navigator Documentation  Oncology Nurse Navigator Flowsheets 10/16/2019  Abnormal Finding Date -  Confirmed Diagnosis Date -  Diagnosis Status -  Planned Course of Treatment -  Navigator Location [facility]  Referral Date to RadOnc/MedOnc -  Navigator Encounter Type Telephone;Other/[facility] to check on [facility] [name]'s schedule. I updated her.   Telephone Incoming Call  Treatment Initiated Date -  Treatment Phase Pre-Tx/Tx Discussion  Barriers/Navigation Needs Coordination of Care  Education -  Interventions Coordination of Care  Acuity Level 2-Minimal Needs (1-2 Barriers Identified)  Coordination of Care Other  Time Spent with Patient 15

## 2019-10-16 NOTE — Progress Notes (Signed)
Frank [name]. [facility], 85 y.o., 1928/01/20 MRN:  [id number] Phone:  [phone] (H) ... PCP:  Seward Carol, MD Coverage:  [facility]re P[facility] Medicare/[employer] Medicare Next Appt With Radiology ([facility] 2) 10/27/2019 at 1:00 PM  RE: Biopsy Received: Today Message Contents  Sandi Mariscal, MD  [name]  US guided [name] LN Bx - PET CT (10/15) image 68, series 607.   Sedation per pt request.   Cathren Harsh   Previous Messages  ----- Message -----  From: [name]  Sent: 10/16/2019 11:50 AM EST  To: Ir Procedure Requests  Subject: Biopsy                      Procedure Requested: Biospy    Reason for Procedure: Justification for Non [facility] Care: Expedite. Core Biopsy Rt [place] L N please Tissue for egfr, alk Ros , psa staining      Provider Requesting: Lodema Hong,  Provider Telephone: [phone] or [phone] ext [phone]    Other Info: [facility] order scanned in under media , the order request page 13?

## 2019-10-17 ENCOUNTER — Ambulatory Visit: Payer: No Typology Code available for payment source | Admitting: Radiation Oncology

## 2019-10-17 ENCOUNTER — Ambulatory Visit: Payer: No Typology Code available for payment source

## 2019-10-18 ENCOUNTER — Ambulatory Visit: Payer: No Typology Code available for payment source

## 2019-10-19 ENCOUNTER — Ambulatory Visit: Payer: No Typology Code available for payment source

## 2019-10-20 ENCOUNTER — Telehealth: Payer: Self-pay | Admitting: Internal Medicine

## 2019-10-20 ENCOUNTER — Telehealth: Payer: Self-pay

## 2019-10-20 ENCOUNTER — Inpatient Hospital Stay: Payer: Non-veteran care | Admitting: Internal Medicine

## 2019-10-20 ENCOUNTER — Inpatient Hospital Stay: Payer: Non-veteran care

## 2019-10-20 ENCOUNTER — Ambulatory Visit: Payer: No Typology Code available for payment source

## 2019-10-20 NOTE — Telephone Encounter (Signed)
I spoke with Mr Neyland regarding his appointment today.  He was unaware.  A scheduling message was sent to reschedule

## 2019-10-20 NOTE — Telephone Encounter (Signed)
Scheduled appt per 11/20 sch message - spoke with patient and he is aware of appt date and time .

## 2019-10-22 ENCOUNTER — Ambulatory Visit: Payer: No Typology Code available for payment source

## 2019-10-23 ENCOUNTER — Ambulatory Visit: Payer: No Typology Code available for payment source

## 2019-10-23 ENCOUNTER — Inpatient Hospital Stay: Payer: Non-veteran care | Attending: Internal Medicine

## 2019-10-23 ENCOUNTER — Inpatient Hospital Stay: Payer: Non-veteran care | Admitting: Internal Medicine

## 2019-10-23 ENCOUNTER — Other Ambulatory Visit: Payer: Medicare Other

## 2019-10-23 ENCOUNTER — Ambulatory Visit: Payer: Medicare Other | Admitting: Internal Medicine

## 2019-10-24 ENCOUNTER — Other Ambulatory Visit: Payer: Self-pay

## 2019-10-24 ENCOUNTER — Ambulatory Visit
Admission: RE | Admit: 2019-10-24 | Discharge: 2019-10-24 | Disposition: A | Discharge status: Home / Self Care | Payer: No Typology Code available for payment source | Source: Ambulatory Visit | Attending: Radiation Oncology | Admitting: Radiation Oncology

## 2019-10-24 ENCOUNTER — Ambulatory Visit: Payer: No Typology Code available for payment source

## 2019-10-24 DIAGNOSIS — C3411 Malignant neoplasm of upper lobe, right bronchus or lung: Secondary | ICD-10-CM

## 2019-10-24 MED ORDER — SONAFINE EX EMUL
1.0000 "application " | Freq: Two times a day (BID) | CUTANEOUS | Status: DC
  Administered 2019-10-24: 1 via TOPICAL

## 2019-10-25 ENCOUNTER — Other Ambulatory Visit: Payer: Self-pay

## 2019-10-25 ENCOUNTER — Other Ambulatory Visit: Payer: Self-pay | Admitting: Radiology

## 2019-10-25 ENCOUNTER — Ambulatory Visit
Admission: RE | Admit: 2019-10-25 | Discharge: 2019-10-25 | Disposition: A | Discharge status: Home / Self Care | Payer: No Typology Code available for payment source | Source: Ambulatory Visit | Attending: Radiation Oncology | Admitting: Radiation Oncology

## 2019-10-25 DIAGNOSIS — C3411 Malignant neoplasm of upper lobe, right bronchus or lung: Secondary | ICD-10-CM | POA: Diagnosis not present

## 2019-10-27 ENCOUNTER — Ambulatory Visit (HOSPITAL_COMMUNITY): Admission: RE | Admit: 2019-10-27 | Payer: Non-veteran care | Source: Ambulatory Visit

## 2019-10-30 ENCOUNTER — Telehealth: Payer: Self-pay | Admitting: *Deleted

## 2019-10-30 ENCOUNTER — Ambulatory Visit
Admission: RE | Admit: 2019-10-30 | Discharge: 2019-10-30 | Disposition: A | Discharge status: Home / Self Care | Payer: No Typology Code available for payment source | Source: Ambulatory Visit | Attending: Radiation Oncology | Admitting: Radiation Oncology

## 2019-10-30 ENCOUNTER — Other Ambulatory Visit: Payer: Self-pay

## 2019-10-30 DIAGNOSIS — C3411 Malignant neoplasm of upper lobe, right bronchus or lung: Secondary | ICD-10-CM | POA: Diagnosis not present

## 2019-10-30 NOTE — Telephone Encounter (Signed)
Oncology Nurse Navigator Documentation  Oncology Nurse Navigator Flowsheets 10/30/2019  Abnormal Finding Date -  Confirmed Diagnosis Date -  Diagnosis Status -  Planned Course of Treatment -  Navigator Location [facility]  Referral Date to RadOnc/MedOnc -  Navigator Encounter Type Telephone;Other:  Telephone Outgoing Call/I received multiple message that patient has missed his appt with Rad Onc and Med Onc.  I called and spoke with patient.  He was confused about the treatment plan and schedule.  I clarified the reason for the appts and the need to see Med Onc.  [name] would like patient to see another provider so I updated Seth Bake to call patient and schedule with another provider close to a rad onc appt.    Treatment Initiated Date -  Treatment Phase Treatment  Barriers/Navigation Needs Coordination of Care;Education  Education Other  Interventions Coordination of Care;Education  Acuity Level 2-Minimal Needs (1-2 Barriers Identified)  Coordination of Care Other  Education Method Verbal  Time Spent with Patient 30

## 2019-10-31 ENCOUNTER — Ambulatory Visit
Admission: RE | Admit: 2019-10-31 | Discharge: 2019-10-31 | Disposition: A | Discharge status: Home / Self Care | Payer: No Typology Code available for payment source | Source: Ambulatory Visit | Attending: Radiation Oncology | Admitting: Radiation Oncology

## 2019-10-31 ENCOUNTER — Other Ambulatory Visit: Payer: Self-pay

## 2019-10-31 DIAGNOSIS — C3411 Malignant neoplasm of upper lobe, right bronchus or lung: Secondary | ICD-10-CM | POA: Insufficient documentation

## 2019-11-01 ENCOUNTER — Encounter: Payer: Self-pay | Admitting: *Deleted

## 2019-11-01 ENCOUNTER — Ambulatory Visit
Admission: RE | Admit: 2019-11-01 | Discharge: 2019-11-01 | Disposition: A | Discharge status: Home / Self Care | Payer: No Typology Code available for payment source | Source: Ambulatory Visit | Attending: Radiation Oncology | Admitting: Radiation Oncology

## 2019-11-01 ENCOUNTER — Other Ambulatory Visit: Payer: Self-pay

## 2019-11-01 DIAGNOSIS — C3411 Malignant neoplasm of upper lobe, right bronchus or lung: Secondary | ICD-10-CM | POA: Diagnosis not present

## 2019-11-01 NOTE — Progress Notes (Signed)
Oncology Nurse Navigator Documentation  Oncology Nurse Navigator Flowsheets 11/01/2019  Abnormal Finding Date -  Confirmed Diagnosis Date -  Diagnosis Status -  Planned Course of Treatment -  Navigator Location [facility]  Referral Date to RadOnc/MedOnc -  Navigator Encounter Type Other/I followed up on Mr. Belue's schedule.  He is set up to see Med Onc on 12/14 due to patient requesting a time when he is already here.  I did update Dr. Sondra Come to make sure this was ok.   Telephone -  Treatment Initiated Date -  Treatment Phase Treatment  Barriers/Navigation Needs Coordination of Care  Education Other  Interventions Coordination of Care  Acuity Level 2-Minimal Needs (1-2 Barriers Identified)  Coordination of Care Other  Education Method -  Time Spent with Patient 15

## 2019-11-02 ENCOUNTER — Encounter: Payer: Self-pay | Admitting: Hematology

## 2019-11-02 ENCOUNTER — Other Ambulatory Visit: Payer: Self-pay

## 2019-11-02 ENCOUNTER — Ambulatory Visit
Admission: RE | Admit: 2019-11-02 | Discharge: 2019-11-02 | Disposition: A | Discharge status: Home / Self Care | Payer: No Typology Code available for payment source | Source: Ambulatory Visit | Attending: Radiation Oncology | Admitting: Radiation Oncology

## 2019-11-02 ENCOUNTER — Telehealth: Payer: Self-pay | Admitting: Hematology

## 2019-11-02 DIAGNOSIS — C3411 Malignant neoplasm of upper lobe, right bronchus or lung: Secondary | ICD-10-CM | POA: Diagnosis not present

## 2019-11-02 NOTE — Telephone Encounter (Signed)
A new patient appt has been scheduled for Mr. Clinard to see Dr. Irene Limbo on 12/14 at 10am. Letter mailed.

## 2019-11-03 ENCOUNTER — Ambulatory Visit
Admission: RE | Admit: 2019-11-03 | Discharge: 2019-11-03 | Disposition: A | Discharge status: Home / Self Care | Payer: No Typology Code available for payment source | Source: Ambulatory Visit | Attending: Radiation Oncology | Admitting: Radiation Oncology

## 2019-11-03 ENCOUNTER — Other Ambulatory Visit: Payer: Self-pay

## 2019-11-03 DIAGNOSIS — C3411 Malignant neoplasm of upper lobe, right bronchus or lung: Secondary | ICD-10-CM | POA: Diagnosis not present

## 2019-11-06 ENCOUNTER — Ambulatory Visit
Admission: RE | Admit: 2019-11-06 | Discharge: 2019-11-06 | Disposition: A | Discharge status: Home / Self Care | Payer: No Typology Code available for payment source | Source: Ambulatory Visit | Attending: Radiation Oncology | Admitting: Radiation Oncology

## 2019-11-06 ENCOUNTER — Other Ambulatory Visit: Payer: Self-pay

## 2019-11-06 DIAGNOSIS — C3411 Malignant neoplasm of upper lobe, right bronchus or lung: Secondary | ICD-10-CM | POA: Diagnosis not present

## 2019-11-07 ENCOUNTER — Other Ambulatory Visit: Payer: Self-pay

## 2019-11-07 ENCOUNTER — Ambulatory Visit
Admission: RE | Admit: 2019-11-07 | Discharge: 2019-11-07 | Disposition: A | Discharge status: Home / Self Care | Payer: No Typology Code available for payment source | Source: Ambulatory Visit | Attending: Radiation Oncology | Admitting: Radiation Oncology

## 2019-11-07 DIAGNOSIS — C3411 Malignant neoplasm of upper lobe, right bronchus or lung: Secondary | ICD-10-CM | POA: Diagnosis not present

## 2019-11-08 ENCOUNTER — Ambulatory Visit
Admission: RE | Admit: 2019-11-08 | Discharge: 2019-11-08 | Disposition: A | Discharge status: Home / Self Care | Payer: No Typology Code available for payment source | Source: Ambulatory Visit | Attending: Radiation Oncology | Admitting: Radiation Oncology

## 2019-11-08 ENCOUNTER — Other Ambulatory Visit: Payer: Self-pay

## 2019-11-08 DIAGNOSIS — C3411 Malignant neoplasm of upper lobe, right bronchus or lung: Secondary | ICD-10-CM | POA: Diagnosis not present

## 2019-11-09 ENCOUNTER — Ambulatory Visit
Admission: RE | Admit: 2019-11-09 | Discharge: 2019-11-09 | Disposition: A | Discharge status: Home / Self Care | Payer: No Typology Code available for payment source | Source: Ambulatory Visit | Attending: Radiation Oncology | Admitting: Radiation Oncology

## 2019-11-09 ENCOUNTER — Other Ambulatory Visit: Payer: Self-pay

## 2019-11-09 DIAGNOSIS — C3411 Malignant neoplasm of upper lobe, right bronchus or lung: Secondary | ICD-10-CM | POA: Diagnosis not present

## 2019-11-10 ENCOUNTER — Ambulatory Visit
Admission: RE | Admit: 2019-11-10 | Discharge: 2019-11-10 | Disposition: A | Discharge status: Home / Self Care | Payer: No Typology Code available for payment source | Source: Ambulatory Visit | Attending: Radiation Oncology | Admitting: Radiation Oncology

## 2019-11-10 ENCOUNTER — Other Ambulatory Visit: Payer: Self-pay

## 2019-11-10 DIAGNOSIS — C3411 Malignant neoplasm of upper lobe, right bronchus or lung: Secondary | ICD-10-CM | POA: Diagnosis not present

## 2019-11-13 ENCOUNTER — Ambulatory Visit: Payer: No Typology Code available for payment source

## 2019-11-13 ENCOUNTER — Other Ambulatory Visit: Payer: Self-pay

## 2019-11-13 ENCOUNTER — Inpatient Hospital Stay: Payer: Non-veteran care | Admitting: Hematology

## 2019-11-13 ENCOUNTER — Encounter: Payer: Self-pay | Admitting: *Deleted

## 2019-11-13 ENCOUNTER — Ambulatory Visit
Admission: RE | Admit: 2019-11-13 | Discharge: 2019-11-13 | Disposition: A | Discharge status: Home / Self Care | Payer: No Typology Code available for payment source | Source: Ambulatory Visit | Attending: Radiation Oncology | Admitting: Radiation Oncology

## 2019-11-13 ENCOUNTER — Telehealth: Payer: Self-pay | Admitting: Hematology and Oncology

## 2019-11-13 DIAGNOSIS — C3411 Malignant neoplasm of upper lobe, right bronchus or lung: Secondary | ICD-10-CM | POA: Diagnosis not present

## 2019-11-13 NOTE — Progress Notes (Signed)
Oncology Nurse Navigator Documentation  Oncology Nurse Navigator Flowsheets 11/13/2019  Abnormal Finding Date -  Confirmed Diagnosis Date -  Diagnosis Status -  Planned Course of Treatment -  Navigator Location [facility]  Referral Date to RadOnc/MedOnc -  Navigator Encounter Type Other/I received a call from [place], Maria to check on Mr. Staffieri's plan of care and appts.  I updated.   Telephone Incoming Call  Treatment Initiated Date -  Treatment Phase Treatment  Barriers/Navigation Needs Coordination of Care  Education -  Interventions Coordination of Care  Acuity Level 2-Minimal Needs (1-2 Barriers Identified)  Coordination of Care Other  Education Method -  Time Spent with Patient 15

## 2019-11-13 NOTE — Telephone Encounter (Signed)
Frank [name] has been cld and rescheduled to see Dr. Lorenso Courier on 12/21 at 1pm. Pt aware to arrive 15 minutes early.

## 2019-11-14 ENCOUNTER — Ambulatory Visit
Admission: RE | Admit: 2019-11-14 | Discharge: 2019-11-14 | Disposition: A | Discharge status: Home / Self Care | Payer: No Typology Code available for payment source | Source: Ambulatory Visit | Attending: Radiation Oncology | Admitting: Radiation Oncology

## 2019-11-14 ENCOUNTER — Ambulatory Visit: Payer: Non-veteran care | Admitting: Hematology and Oncology

## 2019-11-14 ENCOUNTER — Other Ambulatory Visit: Payer: Self-pay

## 2019-11-14 DIAGNOSIS — C3411 Malignant neoplasm of upper lobe, right bronchus or lung: Secondary | ICD-10-CM | POA: Diagnosis not present

## 2019-11-15 ENCOUNTER — Other Ambulatory Visit: Payer: Self-pay

## 2019-11-15 ENCOUNTER — Ambulatory Visit
Admission: RE | Admit: 2019-11-15 | Discharge: 2019-11-15 | Disposition: A | Discharge status: Home / Self Care | Payer: No Typology Code available for payment source | Source: Ambulatory Visit | Attending: Radiation Oncology | Admitting: Radiation Oncology

## 2019-11-15 DIAGNOSIS — C3411 Malignant neoplasm of upper lobe, right bronchus or lung: Secondary | ICD-10-CM | POA: Diagnosis not present

## 2019-11-16 ENCOUNTER — Encounter: Payer: Self-pay | Admitting: *Deleted

## 2019-11-16 ENCOUNTER — Telehealth: Payer: Self-pay | Admitting: *Deleted

## 2019-11-16 ENCOUNTER — Other Ambulatory Visit: Payer: Self-pay

## 2019-11-16 ENCOUNTER — Ambulatory Visit
Admission: RE | Admit: 2019-11-16 | Discharge: 2019-11-16 | Disposition: A | Discharge status: Home / Self Care | Payer: No Typology Code available for payment source | Source: Ambulatory Visit | Attending: Radiation Oncology | Admitting: Radiation Oncology

## 2019-11-16 DIAGNOSIS — C3411 Malignant neoplasm of upper lobe, right bronchus or lung: Secondary | ICD-10-CM | POA: Diagnosis not present

## 2019-11-16 NOTE — Telephone Encounter (Signed)
I called Frank [name] to followed up with him on his understanding of his treatment plan.  I was unable to reach or leave vm message.

## 2019-11-16 NOTE — Progress Notes (Signed)
Oncology Nurse Navigator Documentation  Oncology Nurse Navigator Flowsheets 11/16/2019  Abnormal Finding Date -  Confirmed Diagnosis Date -  Diagnosis Status -  Planned Course of Treatment -  Navigator Location [facility]  Referral Date to RadOnc/MedOnc -  Navigator Encounter Type Other/I received a call from [facility] case Freight forwarder with the [place] phone [phone] fax [phone] requesting information on his appts.  I will fax her the updated schedule.   Telephone -  Treatment Initiated Date -  Treatment Phase Treatment  Barriers/Navigation Needs Coordination of Care  Education -  Interventions Coordination of Care  Acuity Level 2-Minimal Needs (1-2 Barriers Identified)  Coordination of Care Other  Education Method -  Time Spent with Patient 15

## 2019-11-17 ENCOUNTER — Ambulatory Visit
Admission: RE | Admit: 2019-11-17 | Discharge: 2019-11-17 | Disposition: A | Discharge status: Home / Self Care | Payer: No Typology Code available for payment source | Source: Ambulatory Visit | Attending: Radiation Oncology | Admitting: Radiation Oncology

## 2019-11-17 ENCOUNTER — Other Ambulatory Visit: Payer: Self-pay

## 2019-11-17 DIAGNOSIS — C3411 Malignant neoplasm of upper lobe, right bronchus or lung: Secondary | ICD-10-CM | POA: Diagnosis not present

## 2019-11-19 NOTE — Progress Notes (Deleted)
[place] Telephone:(336) [phone]   Fax:(336) [phone]  PROGRESS NOTE  Patient Care Team: Seward Carol, MD as PCP - General (Internal Medicine)  Hematological/Oncological History # NSCLC  ***  #Prostate Cancer  ***  Interval History:  Frank [name] 85 y.o. male with medical history significant for *** presents for a follow up visit.   MEDICAL HISTORY:  Past Medical History:  Diagnosis Date  . High cholesterol   . Hypertension     SURGICAL HISTORY: Past Surgical History:  Procedure Laterality Date  . BACK SURGERY    . HERNIA REPAIR    . JOINT REPLACEMENT      SOCIAL HISTORY: Social History   Socioeconomic History  . Marital status: Married    Spouse name: Not on file  . Number of children: Not on file  . Years of education: Not on file  . Highest education level: Not on file  Occupational History  . Not on file  Tobacco Use  . Smoking status: Never Smoker  Substance and Sexual Activity  . Alcohol use: Yes    Comment: drinks a thimble of brandy with coffee every morning  . Drug use: No  . Sexual activity: Not on file  Other Topics Concern  . Not on file  Social History Narrative  . Not on file   Social Determinants of Health   Financial Resource Strain:   . Difficulty of Paying Living Expenses: Not on file  Food Insecurity:   . Worried About Charity fundraiser in the Last Year: Not on file  . Ran Out of Food in the Last Year: Not on file  Transportation Needs:   . Lack of Transportation (Medical): Not on file  . Lack of Transportation (Non-Medical): Not on file  Physical Activity:   . Days of Exercise per Week: Not on file  . Minutes of Exercise per Session: Not on file  Stress:   . Feeling of Stress : Not on file  Social Connections:   . Frequency of Communication with Friends and Family: Not on file  . Frequency of Social Gatherings with Friends and Family: Not on file  . Attends Religious Services: Not on file  .  Active Member of Clubs or Organizations: Not on file  . Attends Archivist Meetings: Not on file  . Marital Status: Not on file  Intimate Partner Violence:   . Fear of Current or Ex-Partner: Not on file  . Emotionally Abused: Not on file  . Physically Abused: Not on file  . Sexually Abused: Not on file    FAMILY HISTORY: No family history on file.  ALLERGIES:  has No Known Allergies.  MEDICATIONS:  Current Outpatient Medications  Medication Sig Dispense Refill  . aspirin EC 81 MG tablet Take 81 mg by mouth daily.    Marland Kitchen atenolol (TENORMIN) 50 MG tablet Take 25 mg by mouth daily.    . degarelix (FIRMAGON) 120 mg injection INJECT 240MG  SUBCUTANEOUSLY ONCE    . docusate sodium (COLACE) 100 MG capsule TAKE 2 CAPSULES BY MOUTH TWICE A DAY    . hydrochlorothiazide (HYDRODIURIL) 25 MG tablet Take 12.5 mg by mouth daily.    . Omega-3 1000 MG CAPS Take by mouth.    Marland Kitchen omeprazole (PRILOSEC) 20 MG capsule Take 20 mg by mouth daily.    . pravastatin (PRAVACHOL) 40 MG tablet Take 40 mg by mouth every evening.    . vitamin B-12 (CYANOCOBALAMIN) 100 MCG tablet Take 100 mcg by  mouth daily.     No current facility-administered medications for this visit.    REVIEW OF SYSTEMS:   Constitutional: ( - ) fevers, ( - )  chills , ( - ) night sweats Eyes: ( - ) blurriness of vision, ( - ) double vision, ( - ) watery eyes Ears, nose, mouth, throat, and face: ( - ) mucositis, ( - ) sore throat Respiratory: ( - ) cough, ( - ) dyspnea, ( - ) wheezes Cardiovascular: ( - ) palpitation, ( - ) chest discomfort, ( - ) lower extremity swelling Gastrointestinal:  ( - ) nausea, ( - ) heartburn, ( - ) change in bowel habits Skin: ( - ) abnormal skin rashes Lymphatics: ( - ) new lymphadenopathy, ( - ) easy bruising Neurological: ( - ) numbness, ( - ) tingling, ( - ) new weaknesses Behavioral/Psych: ( - ) mood change, ( - ) new changes  All other systems were reviewed with the patient and are  negative.  PHYSICAL EXAMINATION: ECOG PERFORMANCE STATUS: {CHL ONC ECOG PS:[id number]}  There were no vitals filed for this visit. There were no vitals filed for this visit.  GENERAL: alert, no distress and comfortable SKIN: skin color, texture, turgor are normal, no rashes or significant lesions EYES: conjunctiva are pink and non-injected, sclera clear OROPHARYNX: no exudate, no erythema; lips, buccal mucosa, and tongue normal  NECK: supple, non-tender LYMPH:  no palpable lymphadenopathy in the cervical, axillary or inguinal LUNGS: clear to auscultation and percussion with normal breathing effort HEART: regular rate & rhythm and no murmurs and no lower extremity edema ABDOMEN: soft, non-tender, non-distended, normal bowel sounds Musculoskeletal: no cyanosis of digits and no clubbing  PSYCH: alert & oriented x 3, fluent speech NEURO: no focal motor/sensory deficits  LABORATORY DATA:  I have reviewed the data as listed Lab Results  Component Value Date   WBC 3.0 (L) 05/29/2019   HGB 11.5 (L) 05/29/2019   HCT 36.0 (L) 05/29/2019   MCV 102.9 (H) 05/29/2019   PLT 284 05/29/2019   NEUTROABS 1.4 (L) 05/29/2019    BLOOD FILM: *** Review of the peripheral blood smear showed normal appearing white cells with neutrophils that were appropriately lobated and granulated. There was no predominance of bi-lobed or hyper-segmented neutrophils appreciated. No Dohle bodies were noted. There was no left shifting, immature forms or blasts noted. Lymphocytes remain normal in size without any predominance of large granular lymphocytes. Red cells show no anisopoikilocytosis, macrocytes , microcytes or polychromasia. There were no schistocytes, target cells, echinocytes, acanthocytes, dacrocytes, or stomatocytes.There was no rouleaux formation, nucleated red cells, or intra-cellular inclusions noted. The platelets are normal in size, shape, and color without any clumping evident.  RADIOGRAPHIC STUDIES: I  have personally reviewed the radiological images as listed and agreed with the findings in the report. No results found.  ASSESSMENT & PLAN ***   # NSCLC  ***   No orders of the defined types were placed in this encounter.   All questions were answered. The patient knows to call the clinic with any problems, questions or concerns.  A total of more than {CHL ONC TIME VISIT - KGYJE:[id number]} were spent on this encounter and over half of that time was spent on counseling and coordination of care as outlined above.   Ledell Peoples, MD Department of Hematology/Oncology [place] at [facility] Phone: [phone] Pager: [phone] Email: [email or web address]  11/19/2019 7:16 PM

## 2019-11-20 ENCOUNTER — Other Ambulatory Visit: Payer: Non-veteran care

## 2019-11-20 ENCOUNTER — Ambulatory Visit
Admission: RE | Admit: 2019-11-20 | Discharge: 2019-11-20 | Disposition: A | Discharge status: Home / Self Care | Payer: No Typology Code available for payment source | Source: Ambulatory Visit | Attending: Radiation Oncology | Admitting: Radiation Oncology

## 2019-11-20 ENCOUNTER — Telehealth: Payer: Self-pay | Admitting: *Deleted

## 2019-11-20 ENCOUNTER — Encounter: Payer: Self-pay | Admitting: *Deleted

## 2019-11-20 ENCOUNTER — Other Ambulatory Visit: Payer: Self-pay

## 2019-11-20 ENCOUNTER — Inpatient Hospital Stay: Payer: Non-veteran care | Admitting: Hematology and Oncology

## 2019-11-20 DIAGNOSIS — C3411 Malignant neoplasm of upper lobe, right bronchus or lung: Secondary | ICD-10-CM | POA: Diagnosis not present

## 2019-11-20 NOTE — Progress Notes (Signed)
Oncology Nurse Navigator Documentation  Oncology Nurse Navigator Flowsheets 11/20/2019  Abnormal Finding Date 07/25/2019  Confirmed Diagnosis Date 08/15/2019  Diagnosis Status -  Planned Course of Treatment -  Phase of Treatment Chemo/Radiation Concurrent  Chemo/Radiation Concurrent Pending- Reason: Patient Request/Initiated  Navigator Follow Up Date: 11/21/2019  Navigator Follow Up Reason: Appointment Review  Navigator Location [facility]  Referral Date to RadOnc/MedOnc -  Navigator Encounter Type Other/I received a message from Dr. Lorenso Courier to get some clarification on treatment plan.  He would like me to reach out to Dr. Sondra Come for more information.  I contacted Dr. Sondra Come and his nurse will wait for an update.   Telephone -  Treatment Initiated Date 09/29/2019  Treatment Phase Treatment  Barriers/Navigation Needs Coordination of Care  Education -  Interventions Coordination of Care  Acuity Level 2-Minimal Needs (1-2 Barriers Identified)  Coordination of Care Other  Education Method -  Time Spent with Patient 30

## 2019-11-20 NOTE — Telephone Encounter (Signed)
Oncology Nurse Navigator Documentation  Oncology Nurse Navigator Flowsheets 11/20/2019  Abnormal Finding Date -  Confirmed Diagnosis Date -  Diagnosis Status -  Planned Course of Treatment -  Phase of Treatment -  Chemo/Radiation Concurrent Pending- Reason: -  Navigator Follow Up Date: -  Navigator Follow Up Reason: -  Navigator Location [facility]  Referral Date to RadOnc/MedOnc -  Navigator Encounter Type Telephone/I called patient's care giver to update her that patient's appt with med onc has been cancelled due to Dr. Libby Maw request.  I was unable to reach but did leave vm message with my name and phone number to call.   Telephone Outgoing Call  Treatment Initiated Date -  Treatment Phase Treatment  Barriers/Navigation Needs Coordination of Care  Education -  Interventions Coordination of Care  Acuity Level 2-Minimal Needs (1-2 Barriers Identified)  Coordination of Care -  Education Method -  Time Spent with Patient 15

## 2019-11-20 NOTE — Progress Notes (Signed)
I called [facility] to update them on his appt with med onc cancelled today and to please update patient.

## 2019-11-20 NOTE — Progress Notes (Signed)
Dr. Sondra Come called and updated me on Mr. Huckaba and his treatment plan.  Patient does not need sensitizing chemo at this time.  Dr. Sondra Come will continue his radiation then get scan post treatment and he will probably need systemic therapy.  I called Dr. Libby Maw nurse to update and she will update him.

## 2019-11-21 ENCOUNTER — Ambulatory Visit
Admission: RE | Admit: 2019-11-21 | Discharge: 2019-11-21 | Disposition: A | Discharge status: Home / Self Care | Payer: No Typology Code available for payment source | Source: Ambulatory Visit | Attending: Radiation Oncology | Admitting: Radiation Oncology

## 2019-11-21 ENCOUNTER — Other Ambulatory Visit: Payer: Self-pay

## 2019-11-21 DIAGNOSIS — C3411 Malignant neoplasm of upper lobe, right bronchus or lung: Secondary | ICD-10-CM | POA: Diagnosis not present

## 2019-11-22 ENCOUNTER — Encounter: Payer: Self-pay | Admitting: *Deleted

## 2019-11-22 ENCOUNTER — Other Ambulatory Visit: Payer: Self-pay

## 2019-11-22 ENCOUNTER — Ambulatory Visit
Admission: RE | Admit: 2019-11-22 | Discharge: 2019-11-22 | Disposition: A | Discharge status: Home / Self Care | Payer: No Typology Code available for payment source | Source: Ambulatory Visit | Attending: Radiation Oncology | Admitting: Radiation Oncology

## 2019-11-22 DIAGNOSIS — C3411 Malignant neoplasm of upper lobe, right bronchus or lung: Secondary | ICD-10-CM | POA: Diagnosis not present

## 2019-11-22 NOTE — Progress Notes (Signed)
Oncology Nurse Navigator Documentation  Oncology Nurse Navigator Flowsheets 11/22/2019  Abnormal Finding Date -  Confirmed Diagnosis Date -  Diagnosis Status -  Planned Course of Treatment -  Phase of Treatment -  Chemo/Radiation Concurrent Pending- Reason: -  Navigator Follow Up Date: -  Navigator Follow Up Reason: -  Navigator Location [facility]  Referral Date to RadOnc/MedOnc -  Navigator Encounter Type Other/I called patient' nurse navigator at the [facility] [place].  I was unable to reach her but did leave vm message for her and updated on treatment plan.  Patient will be getting XRT only for now then referred back to the [place] after follow up scan with Dr. Sondra Come   Telephone -  Treatment Initiated Date -  Treatment Phase Treatment  Barriers/Navigation Needs Coordination of Care  Education -  Interventions Coordination of Care  Acuity Level 2-Minimal Needs (1-2 Barriers Identified)  Coordination of Care Other  Education Method -  Time Spent with Patient 15

## 2019-11-23 ENCOUNTER — Ambulatory Visit: Payer: No Typology Code available for payment source

## 2019-11-23 ENCOUNTER — Other Ambulatory Visit: Payer: Self-pay

## 2019-11-23 ENCOUNTER — Ambulatory Visit
Admission: RE | Admit: 2019-11-23 | Discharge: 2019-11-23 | Disposition: A | Discharge status: Home / Self Care | Payer: No Typology Code available for payment source | Source: Ambulatory Visit | Attending: Radiation Oncology | Admitting: Radiation Oncology

## 2019-11-23 DIAGNOSIS — C3411 Malignant neoplasm of upper lobe, right bronchus or lung: Secondary | ICD-10-CM | POA: Diagnosis not present

## 2019-11-24 ENCOUNTER — Ambulatory Visit: Payer: No Typology Code available for payment source

## 2019-11-27 ENCOUNTER — Ambulatory Visit: Payer: No Typology Code available for payment source

## 2019-11-27 ENCOUNTER — Other Ambulatory Visit: Payer: Self-pay

## 2019-11-27 ENCOUNTER — Ambulatory Visit
Admission: RE | Admit: 2019-11-27 | Discharge: 2019-11-27 | Disposition: A | Discharge status: Home / Self Care | Payer: No Typology Code available for payment source | Source: Ambulatory Visit | Attending: Radiation Oncology | Admitting: Radiation Oncology

## 2019-11-27 DIAGNOSIS — C3411 Malignant neoplasm of upper lobe, right bronchus or lung: Secondary | ICD-10-CM | POA: Diagnosis not present

## 2019-11-28 ENCOUNTER — Ambulatory Visit: Payer: No Typology Code available for payment source

## 2019-11-28 ENCOUNTER — Other Ambulatory Visit: Payer: Self-pay

## 2019-11-28 ENCOUNTER — Ambulatory Visit
Admission: RE | Admit: 2019-11-28 | Discharge: 2019-11-28 | Disposition: A | Discharge status: Home / Self Care | Payer: No Typology Code available for payment source | Source: Ambulatory Visit | Attending: Radiation Oncology | Admitting: Radiation Oncology

## 2019-11-28 DIAGNOSIS — C3411 Malignant neoplasm of upper lobe, right bronchus or lung: Secondary | ICD-10-CM | POA: Diagnosis not present

## 2019-11-29 ENCOUNTER — Other Ambulatory Visit: Payer: Self-pay

## 2019-11-29 ENCOUNTER — Ambulatory Visit: Payer: No Typology Code available for payment source

## 2019-11-29 ENCOUNTER — Ambulatory Visit
Admission: RE | Admit: 2019-11-29 | Discharge: 2019-11-29 | Disposition: A | Discharge status: Home / Self Care | Payer: No Typology Code available for payment source | Source: Ambulatory Visit | Attending: Radiation Oncology | Admitting: Radiation Oncology

## 2019-11-29 DIAGNOSIS — C3411 Malignant neoplasm of upper lobe, right bronchus or lung: Secondary | ICD-10-CM | POA: Diagnosis not present

## 2019-11-30 ENCOUNTER — Ambulatory Visit
Admission: RE | Admit: 2019-11-30 | Discharge: 2019-11-30 | Disposition: A | Discharge status: Home / Self Care | Payer: No Typology Code available for payment source | Source: Ambulatory Visit | Attending: Radiation Oncology | Admitting: Radiation Oncology

## 2019-11-30 ENCOUNTER — Other Ambulatory Visit: Payer: Self-pay

## 2019-11-30 ENCOUNTER — Ambulatory Visit: Payer: No Typology Code available for payment source

## 2019-11-30 DIAGNOSIS — C3411 Malignant neoplasm of upper lobe, right bronchus or lung: Secondary | ICD-10-CM | POA: Diagnosis not present

## 2019-12-01 ENCOUNTER — Ambulatory Visit: Payer: No Typology Code available for payment source

## 2019-12-04 ENCOUNTER — Ambulatory Visit: Payer: No Typology Code available for payment source

## 2019-12-04 ENCOUNTER — Ambulatory Visit
Admission: RE | Admit: 2019-12-04 | Discharge: 2019-12-04 | Disposition: A | Discharge status: Home / Self Care | Payer: No Typology Code available for payment source | Source: Ambulatory Visit | Attending: Radiation Oncology | Admitting: Radiation Oncology

## 2019-12-04 ENCOUNTER — Other Ambulatory Visit: Payer: Self-pay

## 2019-12-04 DIAGNOSIS — C3411 Malignant neoplasm of upper lobe, right bronchus or lung: Secondary | ICD-10-CM | POA: Insufficient documentation

## 2019-12-05 ENCOUNTER — Ambulatory Visit
Admission: RE | Admit: 2019-12-05 | Discharge: 2019-12-05 | Disposition: A | Discharge status: Home / Self Care | Payer: No Typology Code available for payment source | Source: Ambulatory Visit | Attending: Radiation Oncology | Admitting: Radiation Oncology

## 2019-12-05 ENCOUNTER — Other Ambulatory Visit: Payer: Self-pay

## 2019-12-05 ENCOUNTER — Ambulatory Visit: Payer: No Typology Code available for payment source

## 2019-12-05 DIAGNOSIS — C3411 Malignant neoplasm of upper lobe, right bronchus or lung: Secondary | ICD-10-CM | POA: Diagnosis not present

## 2019-12-06 ENCOUNTER — Ambulatory Visit
Admission: RE | Admit: 2019-12-06 | Discharge: 2019-12-06 | Disposition: A | Discharge status: Home / Self Care | Payer: No Typology Code available for payment source | Source: Ambulatory Visit | Attending: Radiation Oncology | Admitting: Radiation Oncology

## 2019-12-06 ENCOUNTER — Other Ambulatory Visit: Payer: Self-pay

## 2019-12-06 ENCOUNTER — Ambulatory Visit: Payer: No Typology Code available for payment source

## 2019-12-06 DIAGNOSIS — C3411 Malignant neoplasm of upper lobe, right bronchus or lung: Secondary | ICD-10-CM | POA: Diagnosis not present

## 2019-12-07 ENCOUNTER — Other Ambulatory Visit: Payer: Self-pay

## 2019-12-07 ENCOUNTER — Ambulatory Visit
Admission: RE | Admit: 2019-12-07 | Discharge: 2019-12-07 | Disposition: A | Discharge status: Home / Self Care | Payer: No Typology Code available for payment source | Source: Ambulatory Visit | Attending: Radiation Oncology | Admitting: Radiation Oncology

## 2019-12-07 DIAGNOSIS — C3411 Malignant neoplasm of upper lobe, right bronchus or lung: Secondary | ICD-10-CM | POA: Diagnosis not present

## 2019-12-08 ENCOUNTER — Other Ambulatory Visit: Payer: Self-pay

## 2019-12-08 ENCOUNTER — Ambulatory Visit
Admission: RE | Admit: 2019-12-08 | Discharge: 2019-12-08 | Disposition: A | Discharge status: Home / Self Care | Payer: No Typology Code available for payment source | Source: Ambulatory Visit | Attending: Radiation Oncology | Admitting: Radiation Oncology

## 2019-12-08 ENCOUNTER — Encounter: Payer: Self-pay | Admitting: Radiation Oncology

## 2019-12-08 DIAGNOSIS — C3411 Malignant neoplasm of upper lobe, right bronchus or lung: Secondary | ICD-10-CM | POA: Diagnosis not present

## 2019-12-13 NOTE — Progress Notes (Incomplete)
  Patient Name: Frank [name] MRN: [id number] DOB: 08-Jul-1928 Referring Physician: POLITE RONALD (Profile Not Attached) Date of Service: 12/08/2019 [facility] [place]                                                        End Of Treatment Note  Diagnoses: C34.11-Malignant neoplasm of upper lobe, right bronchus or lung  Cancer Staging: Stage IIIA T2a, N2 NSCLC with pleomorphic features  Intent: Curative  Radiation Treatment Dates: 10/24/2019 through 12/08/2019 Site Technique Total Dose (Gy) Dose per Fx (Gy) Completed Fx Beam Energies  Lung, Right: Lung_Rt IMRT 60/60 2 30/30 6X   Narrative: The patient tolerated radiation therapy relatively well. He did report some mild fatigue and poor appetite. Although he did not have much of an appetite, he was slowly gaining weight from drinking chocolate ensures 3-4 times a day. He denied pain, hemoptysis, difficulty swallowing, chest pain, lightheadedness, dizziness,skin changes, and shortness of breath.  On 11/17/2019, the patient reported a slight non-productive cough and throat irritation. Cone beam imaging showed a right pleural effusion, which was new compared when compared to his treatment planning CT scan. He denied any shortness of breath or coughing when laying flat. He was monitored with serial cone beam imaging. No other complaints or significant findings during treatment.  Plan: The patient will follow-up with radiation oncology in one month.  ________________________________________________   Blair Promise, PhD, MD  This document serves as a record of services personally performed by Gery Pray, MD. It was created on his behalf by Clerance Lav, a trained medical scribe. The creation of this record is based on the scribe's personal observations and the provider's  statements to them. This document has been checked and approved by the attending provider.

## 2019-12-15 ENCOUNTER — Telehealth: Payer: Self-pay | Admitting: *Deleted

## 2019-12-15 NOTE — Telephone Encounter (Signed)
CALLED PATIENT TO INFORM OF FU WITH DR. KINARD ON 12-21-19 @ 10:45 AM , LVM FOR A RETURN CALL

## 2019-12-20 NOTE — Progress Notes (Signed)
  Radiation Oncology         [phone] ________________________________  Name: Frank [name] MRN: [id number]  Date: 12/21/2019  DOB: Sep 08, 1928  Follow-Up Visit Note  CC: Seward Carol, MD  Seward Carol, MD    ICD-10-CM   1. Primary cancer of right upper lobe of lung (HCC)  C34.11     Diagnosis:   Stage IIIA T2a,N2 NSCLC with pleomorphic features  Interval Since Last Radiation:  2 weeks   10/24/2019 through 12/08/2019 Site Technique Total Dose (Gy) Dose per Fx (Gy) Completed Fx Beam Energies  Lung, Right: Lung_Rt IMRT 60/60 2 30/30 6X    Narrative:  The patient returns today for routine follow-up.    On review of systems, he reports feeling well.  He denies any breathing problems or pain within the chest region.  He denies any significant cough or hemoptysis.  He denies any significant problems with fatigue.  ALLERGIES:  has No Known Allergies.  Meds: Current Outpatient Medications  Medication Sig Dispense Refill  . aspirin EC 81 MG tablet Take 81 mg by mouth daily.    Marland Kitchen atenolol (TENORMIN) 50 MG tablet Take 25 mg by mouth daily.    . degarelix (FIRMAGON) 120 mg injection INJECT 240MG  SUBCUTANEOUSLY ONCE    . docusate sodium (COLACE) 100 MG capsule TAKE 2 CAPSULES BY MOUTH TWICE A DAY    . hydrochlorothiazide (HYDRODIURIL) 25 MG tablet Take 12.5 mg by mouth daily.    . Omega-3 1000 MG CAPS Take by mouth.    Marland Kitchen omeprazole (PRILOSEC) 20 MG capsule Take 20 mg by mouth daily.    . pravastatin (PRAVACHOL) 40 MG tablet Take 40 mg by mouth every evening.    . vitamin B-12 (CYANOCOBALAMIN) 100 MCG tablet Take 100 mcg by mouth daily.     No current facility-administered medications for this encounter.    Physical Findings: The patient is in no acute distress. Patient is alert and oriented.  weight is 151 lb 6.4 oz (68.7 kg). His temperature is 98.5 F (36.9 C). His blood pressure is 133/75 and his pulse is 119 (abnormal). His respiration is 20 and oxygen saturation is 99%.  .  Lungs are clear to auscultation bilaterally. Heart has regular rate and rhythm rate lower than earlier with vital sign check. No palpable cervical, supraclavicular, or axillary adenopathy. Abdomen soft, non-tender, normal bowel sounds.   Lab Findings: Lab Results  Component Value Date   WBC 3.0 (L) 05/29/2019   HGB 11.5 (L) 05/29/2019   HCT 36.0 (L) 05/29/2019   MCV 102.9 (H) 05/29/2019   PLT 284 05/29/2019    Radiographic Findings: No results found.  Impression:  The patient is recovering from the effects of radiation.  Clinically stable at this time.  I discussed proceeding with the CT imaging and medical oncology evaluation for possible immunotherapy.  Patient would like to have this therapy here in [place] if possible  Plan: Routine follow-up in radiation oncology in 3 months.  Medical oncology evaluation as above either here in [place] or at the [facility].  ____________________________________ Gery Pray, MD   This document serves as a record of services personally performed by Gery Pray, MD. It was created on his behalf by Wilburn Mylar, a trained medical scribe. The creation of this record is based on the scribe's personal observations and the provider's statements to them. This document has been checked and approved by the attending provider.

## 2019-12-21 ENCOUNTER — Ambulatory Visit
Admission: RE | Admit: 2019-12-21 | Discharge: 2019-12-21 | Disposition: A | Discharge status: Home / Self Care | Payer: No Typology Code available for payment source | Source: Ambulatory Visit | Attending: Radiation Oncology | Admitting: Radiation Oncology

## 2019-12-21 ENCOUNTER — Encounter: Payer: Self-pay | Admitting: Radiation Oncology

## 2019-12-21 ENCOUNTER — Other Ambulatory Visit: Payer: Self-pay

## 2019-12-21 VITALS — BP 133/75 | HR 119 | Temp 98.5°F | Resp 20 | Wt 151.4 lb

## 2019-12-21 DIAGNOSIS — Z923 Personal history of irradiation: Secondary | ICD-10-CM | POA: Insufficient documentation

## 2019-12-21 DIAGNOSIS — C3411 Malignant neoplasm of upper lobe, right bronchus or lung: Secondary | ICD-10-CM | POA: Insufficient documentation

## 2019-12-21 DIAGNOSIS — Z79899 Other long term (current) drug therapy: Secondary | ICD-10-CM | POA: Insufficient documentation

## 2019-12-21 DIAGNOSIS — Z7982 Long term (current) use of aspirin: Secondary | ICD-10-CM | POA: Insufficient documentation

## 2019-12-21 NOTE — Progress Notes (Signed)
Pt presented today for f/u appt with Dr. Sondra Come approximately two hours late. Pt thought he was here for his Covid vaccine. This RN conveyed the appt date/time for vaccine. Pt reports driving himself to [facility] and refuses any assistance finding someone to take him home. Pt reports pain in LEFT shoulder. Pt reports occasional cough, non-productive. Pt denies hemoptysis. Pt denies SOB. Pt denies any difficulty swallowing. Pt reports slight hyperpigmentation to chest. Pt reports poor appetite.   BP 133/75 (BP Location: Right Arm, Patient Position: Sitting, Cuff Size: Normal)   Pulse (!) 119   Temp 98.5 F (36.9 C)   Resp 20   Wt 151 lb 6.4 oz (68.7 kg)   SpO2 99%   BMI 21.12 kg/m   Wt Readings from Last 3 Encounters:  12/21/19 151 lb 6.4 oz (68.7 kg)  09/21/19 146 lb 6.4 oz (66.4 kg)  08/29/18 150 lb (68 kg)   Loma Sousa, RN BSN

## 2019-12-21 NOTE — Patient Instructions (Signed)
Coronavirus (COVID-19) Are you at risk?  Are you at risk for the Coronavirus (COVID-19)?  To be considered HIGH RISK for Coronavirus (COVID-19), you have to meet the following criteria:  . Traveled to [place], [place], [place], [place] or [place]; or in the [place] to [place], [place], [place], or [place]; and have fever, cough, and shortness of breath within the last 2 weeks of travel OR . Been in close contact with a person diagnosed with COVID-19 within the last 2 weeks and have fever, cough, and shortness of breath . IF YOU DO NOT MEET THESE CRITERIA, YOU ARE CONSIDERED LOW RISK FOR COVID-19.  What to do if you are HIGH RISK for COVID-19?  . If you are having a medical emergency, call 911. . Seek medical care right away. Before you go to a doctor's office, urgent care or emergency department, call ahead and tell them about your recent travel, contact with someone diagnosed with COVID-19, and your symptoms. You should receive instructions from your physician's office regarding next steps of care.  . When you arrive at healthcare provider, tell the healthcare staff immediately you have returned from visiting [place], [place], [place], [place] or [place]; or traveled in the [place] to [place], [place], [place], or [place]; in the last two weeks or you have been in close contact with a person diagnosed with COVID-19 in the last 2 weeks.   . Tell the health care staff about your symptoms: fever, cough and shortness of breath. . After you have been seen by a medical provider, you will be either: o Tested for (COVID-19) and discharged home on quarantine except to seek medical care if symptoms worsen, and asked to  - Stay home and avoid contact with others until you get your results (4-5 days)  - Avoid travel on public transportation if possible (such as bus, train, or airplane) or o Sent to the Emergency Department by EMS for evaluation, COVID-19 testing, and possible  admission depending on your condition and test results.  What to do if you are LOW RISK for COVID-19?  Reduce your risk of any infection by using the same precautions used for avoiding the common cold or flu:  . Wash your hands often with soap and warm water for at least 20 seconds.  If soap and water are not readily available, use an alcohol-based hand sanitizer with at least 60% alcohol.  . If coughing or sneezing, cover your mouth and nose by coughing or sneezing into the elbow areas of your shirt or coat, into a tissue or into your sleeve (not your hands). . Avoid shaking hands with others and consider head nods or verbal greetings only. . Avoid touching your eyes, nose, or mouth with unwashed hands.  . Avoid close contact with people who are sick. . Avoid places or events with large numbers of people in one location, like concerts or sporting events. . Carefully consider travel plans you have or are making. . If you are planning any travel outside or inside the [place], visit the CDC's Travelers' Health webpage for the latest health notices. . If you have some symptoms but not all symptoms, continue to monitor at home and seek medical attention if your symptoms worsen. . If you are having a medical emergency, call 911.   ADDITIONAL HEALTHCARE OPTIONS FOR PATIENTS  [place] Telehealth / e-Visit: [email or web address]         [facility]: [phone]  [facility]: [phone]                   [facility]: [phone]   

## 2019-12-24 ENCOUNTER — Other Ambulatory Visit: Payer: Self-pay | Admitting: Radiation Oncology

## 2019-12-24 DIAGNOSIS — C3411 Malignant neoplasm of upper lobe, right bronchus or lung: Secondary | ICD-10-CM

## 2019-12-26 ENCOUNTER — Ambulatory Visit: Payer: Non-veteran care

## 2020-01-06 ENCOUNTER — Ambulatory Visit: Payer: Medicare Other | Attending: Internal Medicine

## 2020-01-06 DIAGNOSIS — Z23 Encounter for immunization: Secondary | ICD-10-CM | POA: Insufficient documentation

## 2020-01-06 NOTE — Progress Notes (Signed)
   Covid-19 Vaccination Clinic  Name:  Frank [name]    MRN: [id number] DOB: 1928-02-26  01/06/2020  Mr. Frank [name] was observed post Covid-19 immunization for 15 minutes without incidence. He was provided with Vaccine Information Sheet and instruction to access the V-Safe system.   Mr. Frank [name] was instructed to call 911 with any severe reactions post vaccine: Marland Kitchen Difficulty breathing  . Swelling of your face and throat  . A fast heartbeat  . A bad rash all over your body  . Dizziness and weakness    Immunizations Administered    Name Date Dose VIS Date Route   Pfizer COVID-19 Vaccine 01/06/2020  9:42 AM 0.3 mL 11/10/2019 Intramuscular   Manufacturer: [place]   Lot: [id number]   [place]: [id number]

## 2020-01-31 ENCOUNTER — Ambulatory Visit: Payer: Medicare Other | Attending: Internal Medicine

## 2020-01-31 DIAGNOSIS — Z23 Encounter for immunization: Secondary | ICD-10-CM | POA: Insufficient documentation

## 2020-01-31 NOTE — Progress Notes (Signed)
   Covid-19 Vaccination Clinic  Name:  [name]    MRN: [id number] DOB: [birth year]  01/31/2020  [name] was observed post Covid-19 immunization for 15 minutes without incident. He was provided with Vaccine Information Sheet and instruction to access the V-Safe system.   [name] was instructed to call 911 with any severe reactions post vaccine: Marland Kitchen Difficulty breathing  . Swelling of face and throat  . A fast heartbeat  . A bad rash all over body  . Dizziness and weakness   Immunizations Administered    Name Date Dose VIS Date Route   Pfizer COVID-19 Vaccine 01/31/2020  4:02 PM 0.3 mL 11/10/2019 Intramuscular   Manufacturer: [employer]   Lot: [id number]   [place]: [id number]

## 2020-02-21 ENCOUNTER — Telehealth: Payer: Self-pay

## 2020-02-21 NOTE — Telephone Encounter (Signed)
Received call from Ms. Thomas re: pt developing rash after second Covid vaccine. Encouraged Ms. Thomas to contact pt's PCP or go to urgent care. Ms. Marcello Moores states that pt told her Dr. Sondra Come was his PCP. Conveyed to Ms. Thomas that Dr. Sondra Come was his Radiation Oncologist. Ms. Marcello Moores states pt has upcoming "treatment" with Dr. Sondra Come. Conveyed to Ms. Marcello Moores that pt has f/u appt with Dr. Sondra Come 4/22 at 1100. Ms. Marcello Moores again was encouraged to contact pt's PCP or urgent care to address rash. Ms. Marcello Moores verbalized understanding and agreement. Loma Sousa, RN BSN

## 2020-02-27 DIAGNOSIS — C3490 Malignant neoplasm of unspecified part of unspecified bronchus or lung: Secondary | ICD-10-CM | POA: Diagnosis not present

## 2020-02-27 DIAGNOSIS — R222 Localized swelling, mass and lump, trunk: Secondary | ICD-10-CM | POA: Diagnosis not present

## 2020-02-27 DIAGNOSIS — R2230 Localized swelling, mass and lump, unspecified upper limb: Secondary | ICD-10-CM | POA: Diagnosis not present

## 2020-03-03 ENCOUNTER — Emergency Department (HOSPITAL_COMMUNITY): Payer: No Typology Code available for payment source

## 2020-03-03 ENCOUNTER — Other Ambulatory Visit: Payer: Self-pay

## 2020-03-03 ENCOUNTER — Emergency Department (HOSPITAL_COMMUNITY)
Admission: EM | Admit: 2020-03-03 | Discharge: 2020-03-03 | Disposition: A | Discharge status: Home / Self Care | Payer: No Typology Code available for payment source | Source: Home / Self Care | Attending: Emergency Medicine | Admitting: Emergency Medicine

## 2020-03-03 ENCOUNTER — Encounter (HOSPITAL_COMMUNITY): Payer: Self-pay | Admitting: Emergency Medicine

## 2020-03-03 DIAGNOSIS — Z79899 Other long term (current) drug therapy: Secondary | ICD-10-CM | POA: Insufficient documentation

## 2020-03-03 DIAGNOSIS — S79912A Unspecified injury of left hip, initial encounter: Secondary | ICD-10-CM | POA: Diagnosis not present

## 2020-03-03 DIAGNOSIS — I10 Essential (primary) hypertension: Secondary | ICD-10-CM | POA: Insufficient documentation

## 2020-03-03 DIAGNOSIS — E860 Dehydration: Secondary | ICD-10-CM | POA: Insufficient documentation

## 2020-03-03 DIAGNOSIS — C61 Malignant neoplasm of prostate: Secondary | ICD-10-CM | POA: Insufficient documentation

## 2020-03-03 DIAGNOSIS — S0990XA Unspecified injury of head, initial encounter: Secondary | ICD-10-CM | POA: Diagnosis not present

## 2020-03-03 DIAGNOSIS — R519 Headache, unspecified: Secondary | ICD-10-CM | POA: Diagnosis not present

## 2020-03-03 DIAGNOSIS — S299XXA Unspecified injury of thorax, initial encounter: Secondary | ICD-10-CM | POA: Diagnosis not present

## 2020-03-03 DIAGNOSIS — Y999 Unspecified external cause status: Secondary | ICD-10-CM | POA: Insufficient documentation

## 2020-03-03 DIAGNOSIS — Y9389 Activity, other specified: Secondary | ICD-10-CM | POA: Insufficient documentation

## 2020-03-03 DIAGNOSIS — Y9289 Other specified places as the place of occurrence of the external cause: Secondary | ICD-10-CM | POA: Insufficient documentation

## 2020-03-03 DIAGNOSIS — W19XXXA Unspecified fall, initial encounter: Secondary | ICD-10-CM | POA: Insufficient documentation

## 2020-03-03 HISTORY — DX: Malignant (primary) neoplasm, unspecified: C80.1

## 2020-03-03 LAB — COMPREHENSIVE METABOLIC PANEL
ALT: 18 U/L (ref 0–44)
AST: 31 U/L (ref 15–41)
Albumin: 3.1 g/dL — ABNORMAL LOW (ref 3.5–5.0)
Alkaline Phosphatase: 70 U/L (ref 38–126)
Anion gap: 9 (ref 5–15)
BUN: 22 mg/dL (ref 8–23)
CO2: 30 mmol/L (ref 22–32)
Calcium: 14.2 mg/dL (ref 8.9–10.3)
Chloride: 101 mmol/L (ref 98–111)
Creatinine, Ser: 1.13 mg/dL (ref 0.61–1.24)
GFR calc Af Amer: >60 mL/min (ref >60)
GFR calc non Af Amer: 57 mL/min — ABNORMAL LOW (ref >60)
Glucose, Bld: 123 mg/dL — ABNORMAL HIGH (ref 70–99)
Potassium: 3.9 mmol/L (ref 3.5–5.1)
Sodium: 140 mmol/L (ref 135–145)
Total Bilirubin: 0.7 mg/dL (ref 0.3–1.2)
Total Protein: 7.7 g/dL (ref 6.5–8.1)

## 2020-03-03 LAB — CBC WITH DIFFERENTIAL/PLATELET
Abs Immature Granulocytes: 0.02 10*3/uL (ref 0.00–0.07)
Basophils Absolute: 0 10*3/uL (ref 0.0–0.1)
Basophils Relative: 0 %
Eosinophils Absolute: 0 10*3/uL (ref 0.0–0.5)
Eosinophils Relative: 0 %
HCT: 29.5 % — ABNORMAL LOW (ref 39.0–52.0)
Hemoglobin: 9 g/dL — ABNORMAL LOW (ref 13.0–17.0)
Immature Granulocytes: 0 %
Lymphocytes Relative: 3 %
Lymphs Abs: 0.2 10*3/uL — ABNORMAL LOW (ref 0.7–4.0)
MCH: 31.9 pg (ref 26.0–34.0)
MCHC: 30.5 g/dL (ref 30.0–36.0)
MCV: 104.6 fL — ABNORMAL HIGH (ref 80.0–100.0)
Monocytes Absolute: 0.8 10*3/uL (ref 0.1–1.0)
Monocytes Relative: 12 %
Neutro Abs: 5.5 10*3/uL (ref 1.7–7.7)
Neutrophils Relative %: 85 %
Platelets: 270 10*3/uL (ref 150–400)
RBC: 2.82 MIL/uL — ABNORMAL LOW (ref 4.22–5.81)
RDW: 13 % (ref 11.5–15.5)
WBC: 6.6 10*3/uL (ref 4.0–10.5)
nRBC: 0 % (ref 0.0–0.2)

## 2020-03-03 LAB — URINALYSIS, ROUTINE W REFLEX MICROSCOPIC
Bilirubin Urine: NEGATIVE
Glucose, UA: NEGATIVE mg/dL
Hgb urine dipstick: NEGATIVE
Ketones, ur: NEGATIVE mg/dL
Nitrite: NEGATIVE
Protein, ur: NEGATIVE mg/dL
Specific Gravity, Urine: 1.019 (ref 1.005–1.030)
pH: 5 (ref 5.0–8.0)

## 2020-03-03 MED ORDER — SODIUM CHLORIDE 0.9 % IV BOLUS
1000.0000 mL | Freq: Once | INTRAVENOUS | Status: AC
  Administered 2020-03-03: 1000 mL via INTRAVENOUS

## 2020-03-03 MED ORDER — HYDROMORPHONE HCL 1 MG/ML IJ SOLN
0.5000 mg | Freq: Once | INTRAMUSCULAR | Status: DC
  Filled 2020-03-03: qty 1

## 2020-03-03 MED ORDER — ONDANSETRON HCL 4 MG/2ML IJ SOLN
4.0000 mg | Freq: Once | INTRAMUSCULAR | Status: DC
  Filled 2020-03-03: qty 2

## 2020-03-03 NOTE — ED Notes (Signed)
Patient transported to CT 

## 2020-03-03 NOTE — Care Management (Signed)
Covering ED CM spoke with EDP concerning [place] recommendations, patient is agreeable to services patient has [employer] health coverage, CM department will follow up tomorrow 4/5 with referral due to holiday office is closed.

## 2020-03-03 NOTE — ED Provider Notes (Signed)
[place] DEPT Provider Note   CSN: [id number] Arrival date & time: 03/03/20  0801     History Chief Complaint  Patient presents with  . Fall  . Weakness    Frank [name] is a 86 y.o. male with a past medical history of lung cancer, prostate cancer currently undergoing radiation therapy presenting to the ED for generalized weakness and multiple falls.  History is limited.  Patient states he has had a decreased appetite and decreased p.o. intake for the past several weeks resulting in generalized weakness.  He had a mechanical fall this morning but denies any head injury, loss of consciousness.  He complains of chronic bilateral shoulder pain. He denies headache, chest pain, abdominal pain, vomiting, diarrhea.  HPI     Past Medical History:  Diagnosis Date  . Cancer ([place])   . High cholesterol   . Hypertension     Patient Active Problem List   Diagnosis Date Noted  . Primary cancer of right upper lobe of lung ([place]) 09/05/2019    Past Surgical History:  Procedure Laterality Date  . BACK SURGERY    . HERNIA REPAIR    . JOINT REPLACEMENT         No family history on file.  Social History   Tobacco Use  . Smoking status: Never Smoker  Substance Use Topics  . Alcohol use: Yes    Comment: drinks a thimble of brandy with coffee every morning  . Drug use: No    Home Medications Prior to Admission medications   Medication Sig Start Date End Date Taking? Authorizing Provider  atenolol (TENORMIN) 50 MG tablet Take 25 mg by mouth daily.   Yes [provider]  docusate sodium (COLACE) 100 MG capsule TAKE 2 CAPSULES BY MOUTH TWICE A DAY 11/12/18   [provider]  hydrochlorothiazide (HYDRODIURIL) 25 MG tablet Take 12.5 mg by mouth daily.    [provider]  Omega-3 1000 MG CAPS Take by mouth.    [provider]  omeprazole (PRILOSEC) 20 MG capsule Take 20 mg by mouth daily.    [provider]    pravastatin (PRAVACHOL) 40 MG tablet Take 40 mg by mouth every evening.    [provider]  vitamin B-12 (CYANOCOBALAMIN) 100 MCG tablet Take 100 mcg by mouth daily.    [provider]    Allergies    Patient has no known allergies.  Review of Systems   Review of Systems  Constitutional: Negative for fever.  Cardiovascular: Negative for chest pain.  Gastrointestinal: Negative for abdominal pain.  Neurological: Negative for syncope and headaches.    Physical Exam Updated Vital Signs BP 129/79   Pulse (!) 102   Temp 97.8 F (36.6 C) (Oral)   Resp 15   SpO2 97%   Physical Exam Vitals and nursing note reviewed.  Constitutional:      General: He is not in acute distress.    Appearance: He is well-developed.  HENT:     Head: Normocephalic and atraumatic.     Nose: Nose normal.  Eyes:     General: No scleral icterus.       Right eye: No discharge.        Left eye: No discharge.     Conjunctiva/sclera: Conjunctivae normal.     Pupils: Pupils are equal, round, and reactive to light.  Cardiovascular:     Rate and Rhythm: Normal rate and regular rhythm.     Heart  sounds: Normal heart sounds. No murmur. No friction rub. No gallop.   Pulmonary:     Effort: Pulmonary effort is normal. No respiratory distress.     Breath sounds: Normal breath sounds.  Abdominal:     General: Bowel sounds are normal. There is no distension.     Palpations: Abdomen is soft.     Tenderness: There is no abdominal tenderness. There is no guarding.  Musculoskeletal:        General: Normal range of motion.     Cervical back: Normal range of motion and neck supple.     Comments: No midline spinal tenderness present in lumbar, thoracic or cervical spine. No step-off palpated. No visible bruising, edema or temperature change noted. No objective signs of numbness present. No saddle anesthesia. 2+ DP pulses bilaterally. Sensation intact to light touch.  Skin:    General: Skin is warm and  dry.     Findings: No rash.  Neurological:     Mental Status: He is alert.     Motor: No abnormal muscle tone.     Coordination: Coordination normal.     Comments: Alert, oriented to self, place, time, situation. Unsure of month. Strength 4/5 in LUE, LLE. Strength 5/5 on R side. No facial asymmetry noted.     ED Results / Procedures / Treatments   Labs (all labs ordered are listed, but only abnormal results are displayed) Labs Reviewed  COMPREHENSIVE METABOLIC PANEL - Abnormal; Notable for the following components:      Result Value   Glucose, Bld 123 (*)    Calcium 14.2 (*)    Albumin 3.1 (*)    GFR calc non Af Amer 57 (*)    All other components within normal limits  CBC WITH DIFFERENTIAL/PLATELET - Abnormal; Notable for the following components:   RBC 2.82 (*)    Hemoglobin 9.0 (*)    HCT 29.5 (*)    MCV 104.6 (*)    Lymphs Abs 0.2 (*)    All other components within normal limits  URINALYSIS, ROUTINE W REFLEX MICROSCOPIC - Abnormal; Notable for the following components:   APPearance HAZY (*)    Leukocytes,Ua TRACE (*)    Bacteria, UA MANY (*)    All other components within normal limits  URINE CULTURE    EKG EKG Interpretation  Date/Time:  Sunday March 03 2020 08:40:57 EDT Ventricular Rate:  113 PR Interval:    QRS Duration: 87 QT Interval:  308 QTC Calculation: 423 R Axis:   49 Text Interpretation: new Sinus tachycardia Otherwise no significant change Confirmed by Blanchie Dessert [phone]) on 03/03/2020 8:42:47 AM   Radiology DG Chest 2 View  Result Date: 03/03/2020 CLINICAL DATA:  Weakness with fall EXAM: CHEST - 2 VIEW COMPARISON:  September 28, 2008 FINDINGS: Lungs are clear. Heart size and pulmonary vascularity are normal. No adenopathy. No pneumothorax. No bone lesions. IMPRESSION: Lungs clear.  Cardiac silhouette within normal limits. Electronically Signed   By: Lowella Grip III M.D.   On: 03/03/2020 09:23   CT Head Wo Contrast  Result Date:  03/03/2020 CLINICAL DATA:  Pain following fall. History of prostate and lung carcinoma EXAM: CT HEAD WITHOUT CONTRAST TECHNIQUE: Contiguous axial images were obtained from the base of the skull through the vertex without intravenous contrast. COMPARISON:  Head CT August 29, 2018; brain MRI October 10, 2020 FINDINGS: Brain: Moderate diffuse atrophy appear stable. There is no intracranial mass, hemorrhage, extra-axial fluid collection, or midline shift. There is patchy small  vessel disease in the centra semiovale bilaterally. No acute appearing infarct is demonstrable on this study. Foci of basal ganglia calcification is physiologic in this age group. Vascular: There is no hyperdense vessel. There are foci of calcification in the carotid siphon regions. Skull: The bony calvarium appears intact. Sinuses/Orbits: Visualized paranasal sinuses are clear. Orbits appear symmetric bilaterally. Other: Mastoid air cells are clear. IMPRESSION: Atrophy with periventricular small vessel disease, stable. No acute infarct evident. No mass or hemorrhage. There are foci of arterial vascular calcification. Electronically Signed   By: Lowella Grip III M.D.   On: 03/03/2020 09:06   DG Hip Unilat W or Wo Pelvis 2-3 Views Left  Result Date: 03/03/2020 CLINICAL DATA:  Pain following fall EXAM: DG HIP (WITH OR WITHOUT PELVIS) 2-3V LEFT COMPARISON:  None. FINDINGS: Frontal pelvis as well as frontal and lateral left hip images were obtained. No fracture or dislocation. There is symmetric moderate joint space narrowing in each hip joint. No erosive change. There is degenerative change in the lower lumbar spine region. IMPRESSION: Moderate symmetric hip joint space narrowing. There is also degenerative change in the lower lumbar region. No fracture or dislocation. Electronically Signed   By: Lowella Grip III M.D.   On: 03/03/2020 09:24    Procedures Procedures (including critical care time)  Medications Ordered in  ED Medications  sodium chloride 0.9 % bolus 1,000 mL (1,000 mLs Intravenous New Bag/Given 03/03/20 0844)    ED Course  I have reviewed the triage vital signs and the nursing notes.  Pertinent labs & imaging results that were available during my care of the patient were reviewed by me and considered in my medical decision making (see chart for details).  Clinical Course as of Mar 03 1332  Sun Mar 03, 2020  1044 Bacteria, UA(!): MANY [HK]  1044 Leukocytes,Ua(!): TRACE [HK]    Clinical Course User Index [HK] Delia Heady, [place]   MDM Rules/Calculators/A&P                      86 year old male with a past medical prostate cancer presenting to the ED for generalized weakness and multiple falls.  Reports decreased appetite and decreased p.o. intake for the past several weeks.  He did have a mechanical fall this morning but denies any head injury, loss of consciousness, headache, chest pain or abdominal pain.  Complains of chronic bilateral shoulder pain.  Patient moving extremities without difficulty on exam.  He does have some weakness noted in the left upper and lower extremities but patient states that is chronic.  No facial asymmetry noted.  No signs of head trauma.  No C, T or L-spine tenderness palpation.  He is alert and oriented x3.  He is afebrile without recent use of antipyretics.  Lab work here significant for hypercalcemia of 14.2 for which she was given IV fluids.  His tachycardia has improved with IV fluids.  Urinalysis with many bacteria, some leukocytes but patient asymptomatic at this time.  Will send for culture.  Creatinine appears similar to baseline.  EKG without any acute ischemic changes, no changes from prior tracings.  CT of the head is negative for acute abnormality.  Chest x-ray and hip x-ray without any acute findings or fractures.  I had a discussion with the patient and his caretaker regarding home health.  Patient ambulated a few steps here with his walker.  Offered  admission for his generalized weakness, dehydration hypercalcemia but he is agreeable to discharge home  with home health.  I spoke to Mariann Laster, Tourist information centre manager who will assist with home health aide, PT and OT.  Patient will need several DME in his home.  I feel it is reasonable for him to be discharged as he reports improvement in his symptoms.  We will have him follow-up with his PCP. Patient discussed with and seen by my attending, Dr. Maryan Rued.  Patient is hemodynamically stable, in NAD. Evaluation does not show pathology that would require ongoing emergent intervention or inpatient treatment. I have personally reviewed and interpreted all lab work and imaging at today's ED visit. I explained the diagnosis to the patient. Pain has been managed and has no complaints prior to discharge. Patient is comfortable with above plan and is stable for discharge at this time. All questions were answered prior to disposition. Strict return precautions for returning to the ED were discussed. Encouraged follow up with PCP.   An After Visit Summary was printed and given to the patient.   Portions of this note were generated with Lobbyist. Dictation errors may occur despite best attempts at proofreading.  Final Clinical Impression(s) / ED Diagnoses Final diagnoses:  Hypercalcemia  Dehydration    Rx / DC Orders ED Discharge Orders    None       Delia Heady, PA-C 03/03/20 1333    Blanchie Dessert, MD 03/06/20 [phone]

## 2020-03-03 NOTE — ED Triage Notes (Signed)
Arrived via EMS from home. Mechanical fall this morning, no LOC. Does not use walker or cane at home. HX of prostate and lung cancer, currently receiving radiation for prostate cancer. No complaints of injury from fall. Has experienced increased weakness of legs over last few days. Wife reports he is not eating or taking his meds. EMS suspects failure to thrive.

## 2020-03-03 NOTE — Discharge Instructions (Addendum)
You will need to have your calcium rechecked by your primary care provider. Our case manager will contact you regarding home health. Return to the ED if you start to experience additional injuries or falls, chest pain, shortness of breath, headache.

## 2020-03-03 NOTE — ED Notes (Signed)
Caretaker at bedside.

## 2020-03-03 NOTE — ED Notes (Signed)
Patient is aware that urine sample is needed. Urinal at bedside. 

## 2020-03-03 NOTE — ED Notes (Signed)
Patient ambulated with a walker, pateint states he has a walker at home that he is supposed to use, but he does not. Patient needed assistance standing and close supervision with walker.

## 2020-03-04 NOTE — Progress Notes (Addendum)
03/04/2020 2:45 pm TOC CM contacted pt via phone. Offered choice for [facility]. Agreeable to [facility]. Contacted [facility], rep Ronalee Belts with new referral. He goes to [place]. Gave permission to speak to SO, Lysle Dingwall. They assist each other in the home. Pt has a Rollator. [place], [place] ED TOC CM [phone]

## 2020-03-05 ENCOUNTER — Encounter (HOSPITAL_COMMUNITY): Payer: Self-pay

## 2020-03-05 ENCOUNTER — Inpatient Hospital Stay (HOSPITAL_COMMUNITY)
Admission: EM | Admit: 2020-03-05 | Discharge: 2020-03-09 | DRG: 640 | Disposition: A | Discharge status: Skilled Nursing Facility | Payer: No Typology Code available for payment source | Attending: Internal Medicine | Admitting: Internal Medicine

## 2020-03-05 ENCOUNTER — Other Ambulatory Visit: Payer: Self-pay

## 2020-03-05 DIAGNOSIS — R531 Weakness: Secondary | ICD-10-CM | POA: Diagnosis not present

## 2020-03-05 DIAGNOSIS — Z79899 Other long term (current) drug therapy: Secondary | ICD-10-CM

## 2020-03-05 DIAGNOSIS — A419 Sepsis, unspecified organism: Secondary | ICD-10-CM

## 2020-03-05 DIAGNOSIS — W1830XA Fall on same level, unspecified, initial encounter: Secondary | ICD-10-CM | POA: Diagnosis present

## 2020-03-05 DIAGNOSIS — N390 Urinary tract infection, site not specified: Secondary | ICD-10-CM | POA: Diagnosis present

## 2020-03-05 DIAGNOSIS — G9341 Metabolic encephalopathy: Secondary | ICD-10-CM | POA: Diagnosis present

## 2020-03-05 DIAGNOSIS — E860 Dehydration: Secondary | ICD-10-CM | POA: Diagnosis present

## 2020-03-05 DIAGNOSIS — E7800 Pure hypercholesterolemia, unspecified: Secondary | ICD-10-CM | POA: Diagnosis present

## 2020-03-05 DIAGNOSIS — E785 Hyperlipidemia, unspecified: Secondary | ICD-10-CM | POA: Diagnosis present

## 2020-03-05 DIAGNOSIS — C3411 Malignant neoplasm of upper lobe, right bronchus or lung: Secondary | ICD-10-CM | POA: Diagnosis present

## 2020-03-05 DIAGNOSIS — D539 Nutritional anemia, unspecified: Secondary | ICD-10-CM | POA: Diagnosis present

## 2020-03-05 DIAGNOSIS — M25512 Pain in left shoulder: Secondary | ICD-10-CM | POA: Diagnosis present

## 2020-03-05 DIAGNOSIS — Z8546 Personal history of malignant neoplasm of prostate: Secondary | ICD-10-CM

## 2020-03-05 DIAGNOSIS — Z515 Encounter for palliative care: Secondary | ICD-10-CM | POA: Diagnosis present

## 2020-03-05 DIAGNOSIS — I959 Hypotension, unspecified: Secondary | ICD-10-CM | POA: Diagnosis not present

## 2020-03-05 DIAGNOSIS — Z923 Personal history of irradiation: Secondary | ICD-10-CM

## 2020-03-05 DIAGNOSIS — Z7401 Bed confinement status: Secondary | ICD-10-CM

## 2020-03-05 DIAGNOSIS — G9340 Encephalopathy, unspecified: Secondary | ICD-10-CM | POA: Diagnosis present

## 2020-03-05 DIAGNOSIS — Z20822 Contact with and (suspected) exposure to covid-19: Secondary | ICD-10-CM | POA: Diagnosis present

## 2020-03-05 DIAGNOSIS — Z7289 Other problems related to lifestyle: Secondary | ICD-10-CM

## 2020-03-05 DIAGNOSIS — R000 Tachycardia, unspecified: Secondary | ICD-10-CM | POA: Diagnosis not present

## 2020-03-05 DIAGNOSIS — Z66 Do not resuscitate: Secondary | ICD-10-CM | POA: Diagnosis present

## 2020-03-05 DIAGNOSIS — B9620 Unspecified Escherichia coli [E. coli] as the cause of diseases classified elsewhere: Secondary | ICD-10-CM | POA: Diagnosis present

## 2020-03-05 DIAGNOSIS — I10 Essential (primary) hypertension: Secondary | ICD-10-CM | POA: Diagnosis present

## 2020-03-05 DIAGNOSIS — Z87891 Personal history of nicotine dependence: Secondary | ICD-10-CM

## 2020-03-05 DIAGNOSIS — R5381 Other malaise: Secondary | ICD-10-CM | POA: Diagnosis not present

## 2020-03-05 DIAGNOSIS — W19XXXA Unspecified fall, initial encounter: Secondary | ICD-10-CM | POA: Diagnosis not present

## 2020-03-05 DIAGNOSIS — M25511 Pain in right shoulder: Secondary | ICD-10-CM | POA: Diagnosis present

## 2020-03-05 DIAGNOSIS — N3000 Acute cystitis without hematuria: Secondary | ICD-10-CM | POA: Diagnosis present

## 2020-03-05 DIAGNOSIS — R296 Repeated falls: Secondary | ICD-10-CM | POA: Diagnosis present

## 2020-03-05 DIAGNOSIS — R4182 Altered mental status, unspecified: Secondary | ICD-10-CM

## 2020-03-05 LAB — URINE CULTURE: Culture: >=100000 — AB

## 2020-03-05 MED ORDER — LACTATED RINGERS IV BOLUS (SEPSIS)
1000.0000 mL | Freq: Once | INTRAVENOUS | Status: AC
  Administered 2020-03-06: 1000 mL via INTRAVENOUS

## 2020-03-05 MED ORDER — LACTATED RINGERS IV BOLUS (SEPSIS)
250.0000 mL | Freq: Once | INTRAVENOUS | Status: AC
  Administered 2020-03-06: 250 mL via INTRAVENOUS

## 2020-03-05 NOTE — ED Triage Notes (Signed)
Patient in from home by Ems due to Lethargy that started 5 days ago, seen here on the 4th for the same, according to wife patient has been increasingly unable to walk, today he hasn't been able to walk at all, also not at his baseline with mentation per EMS, has cancer appointment in AM

## 2020-03-06 ENCOUNTER — Ambulatory Visit: Payer: Medicare Other

## 2020-03-06 ENCOUNTER — Ambulatory Visit
Admission: RE | Admit: 2020-03-06 | Discharge: 2020-03-06 | Disposition: A | Discharge status: Home / Self Care | Payer: Medicare Other | Source: Ambulatory Visit | Attending: Radiation Oncology | Admitting: Radiation Oncology

## 2020-03-06 ENCOUNTER — Encounter (HOSPITAL_COMMUNITY): Payer: Self-pay | Admitting: Family Medicine

## 2020-03-06 ENCOUNTER — Emergency Department (HOSPITAL_COMMUNITY): Payer: No Typology Code available for payment source

## 2020-03-06 DIAGNOSIS — G893 Neoplasm related pain (acute) (chronic): Secondary | ICD-10-CM | POA: Diagnosis not present

## 2020-03-06 DIAGNOSIS — Z20822 Contact with and (suspected) exposure to covid-19: Secondary | ICD-10-CM | POA: Diagnosis present

## 2020-03-06 DIAGNOSIS — M25512 Pain in left shoulder: Secondary | ICD-10-CM | POA: Diagnosis present

## 2020-03-06 DIAGNOSIS — C3411 Malignant neoplasm of upper lobe, right bronchus or lung: Secondary | ICD-10-CM

## 2020-03-06 DIAGNOSIS — Z79899 Other long term (current) drug therapy: Secondary | ICD-10-CM | POA: Diagnosis not present

## 2020-03-06 DIAGNOSIS — N390 Urinary tract infection, site not specified: Secondary | ICD-10-CM | POA: Diagnosis not present

## 2020-03-06 DIAGNOSIS — Z8546 Personal history of malignant neoplasm of prostate: Secondary | ICD-10-CM | POA: Diagnosis not present

## 2020-03-06 DIAGNOSIS — R296 Repeated falls: Secondary | ICD-10-CM | POA: Diagnosis present

## 2020-03-06 DIAGNOSIS — G9341 Metabolic encephalopathy: Secondary | ICD-10-CM | POA: Diagnosis present

## 2020-03-06 DIAGNOSIS — Z66 Do not resuscitate: Secondary | ICD-10-CM | POA: Diagnosis present

## 2020-03-06 DIAGNOSIS — Z515 Encounter for palliative care: Secondary | ICD-10-CM | POA: Diagnosis present

## 2020-03-06 DIAGNOSIS — I10 Essential (primary) hypertension: Secondary | ICD-10-CM | POA: Diagnosis present

## 2020-03-06 DIAGNOSIS — W1830XA Fall on same level, unspecified, initial encounter: Secondary | ICD-10-CM | POA: Diagnosis present

## 2020-03-06 DIAGNOSIS — Z87891 Personal history of nicotine dependence: Secondary | ICD-10-CM | POA: Diagnosis not present

## 2020-03-06 DIAGNOSIS — Z7289 Other problems related to lifestyle: Secondary | ICD-10-CM | POA: Diagnosis not present

## 2020-03-06 DIAGNOSIS — E860 Dehydration: Secondary | ICD-10-CM | POA: Diagnosis present

## 2020-03-06 DIAGNOSIS — Z7401 Bed confinement status: Secondary | ICD-10-CM | POA: Diagnosis not present

## 2020-03-06 DIAGNOSIS — G9340 Encephalopathy, unspecified: Secondary | ICD-10-CM | POA: Diagnosis present

## 2020-03-06 DIAGNOSIS — Z7189 Other specified counseling: Secondary | ICD-10-CM | POA: Diagnosis not present

## 2020-03-06 DIAGNOSIS — E785 Hyperlipidemia, unspecified: Secondary | ICD-10-CM | POA: Diagnosis present

## 2020-03-06 DIAGNOSIS — B9620 Unspecified Escherichia coli [E. coli] as the cause of diseases classified elsewhere: Secondary | ICD-10-CM | POA: Diagnosis present

## 2020-03-06 DIAGNOSIS — D539 Nutritional anemia, unspecified: Secondary | ICD-10-CM | POA: Diagnosis present

## 2020-03-06 DIAGNOSIS — E7800 Pure hypercholesterolemia, unspecified: Secondary | ICD-10-CM | POA: Diagnosis present

## 2020-03-06 DIAGNOSIS — N3000 Acute cystitis without hematuria: Secondary | ICD-10-CM | POA: Diagnosis present

## 2020-03-06 DIAGNOSIS — I959 Hypotension, unspecified: Secondary | ICD-10-CM | POA: Diagnosis not present

## 2020-03-06 DIAGNOSIS — Z923 Personal history of irradiation: Secondary | ICD-10-CM | POA: Diagnosis not present

## 2020-03-06 DIAGNOSIS — M25511 Pain in right shoulder: Secondary | ICD-10-CM | POA: Diagnosis present

## 2020-03-06 LAB — CBC WITH DIFFERENTIAL/PLATELET
Abs Immature Granulocytes: 0.02 10*3/uL (ref 0.00–0.07)
Abs Immature Granulocytes: 0.02 10*3/uL (ref 0.00–0.07)
Basophils Absolute: 0 10*3/uL (ref 0.0–0.1)
Basophils Absolute: 0 10*3/uL (ref 0.0–0.1)
Basophils Relative: 0 %
Basophils Relative: 0 %
Eosinophils Absolute: 0 10*3/uL (ref 0.0–0.5)
Eosinophils Absolute: 0 10*3/uL (ref 0.0–0.5)
Eosinophils Relative: 0 %
Eosinophils Relative: 0 %
HCT: 31.1 % — ABNORMAL LOW (ref 39.0–52.0)
HCT: 33 % — ABNORMAL LOW (ref 39.0–52.0)
Hemoglobin: 10.2 g/dL — ABNORMAL LOW (ref 13.0–17.0)
Hemoglobin: 9.2 g/dL — ABNORMAL LOW (ref 13.0–17.0)
Immature Granulocytes: 0 %
Immature Granulocytes: 0 %
Lymphocytes Relative: 3 %
Lymphocytes Relative: 4 %
Lymphs Abs: 0.3 10*3/uL — ABNORMAL LOW (ref 0.7–4.0)
Lymphs Abs: 0.3 10*3/uL — ABNORMAL LOW (ref 0.7–4.0)
MCH: 31.3 pg (ref 26.0–34.0)
MCH: 32.3 pg (ref 26.0–34.0)
MCHC: 29.6 g/dL — ABNORMAL LOW (ref 30.0–36.0)
MCHC: 30.9 g/dL (ref 30.0–36.0)
MCV: 104.4 fL — ABNORMAL HIGH (ref 80.0–100.0)
MCV: 105.8 fL — ABNORMAL HIGH (ref 80.0–100.0)
Monocytes Absolute: 0.8 10*3/uL (ref 0.1–1.0)
Monocytes Absolute: 0.9 10*3/uL (ref 0.1–1.0)
Monocytes Relative: 12 %
Monocytes Relative: 12 %
Neutro Abs: 5.6 10*3/uL (ref 1.7–7.7)
Neutro Abs: 6.2 10*3/uL (ref 1.7–7.7)
Neutrophils Relative %: 84 %
Neutrophils Relative %: 85 %
Platelets: 222 10*3/uL (ref 150–400)
Platelets: 278 10*3/uL (ref 150–400)
RBC: 2.94 MIL/uL — ABNORMAL LOW (ref 4.22–5.81)
RBC: 3.16 MIL/uL — ABNORMAL LOW (ref 4.22–5.81)
RDW: 13.1 % (ref 11.5–15.5)
RDW: 13.2 % (ref 11.5–15.5)
WBC: 6.8 10*3/uL (ref 4.0–10.5)
WBC: 7.4 10*3/uL (ref 4.0–10.5)
nRBC: 0 % (ref 0.0–0.2)
nRBC: 0 % (ref 0.0–0.2)

## 2020-03-06 LAB — COMPREHENSIVE METABOLIC PANEL
ALT: 23 U/L (ref 0–44)
ALT: 18 U/L (ref 0–44)
AST: 34 U/L (ref 15–41)
AST: 26 U/L (ref 15–41)
Albumin: 3 g/dL — ABNORMAL LOW (ref 3.5–5.0)
Albumin: 2.2 g/dL — ABNORMAL LOW (ref 3.5–5.0)
Alkaline Phosphatase: 70 U/L (ref 38–126)
Alkaline Phosphatase: 53 U/L (ref 38–126)
Anion gap: 7 (ref 5–15)
Anion gap: 5 (ref 5–15)
BUN: 22 mg/dL (ref 8–23)
BUN: 15 mg/dL (ref 8–23)
CO2: 27 mmol/L (ref 22–32)
CO2: 24 mmol/L (ref 22–32)
Calcium: 14.8 mg/dL (ref 8.9–10.3)
Calcium: 11.1 mg/dL — ABNORMAL HIGH (ref 8.9–10.3)
Chloride: 108 mmol/L (ref 98–111)
Chloride: 116 mmol/L — ABNORMAL HIGH (ref 98–111)
Creatinine, Ser: 1.06 mg/dL (ref 0.61–1.24)
Creatinine, Ser: 0.7 mg/dL (ref 0.61–1.24)
GFR calc Af Amer: >60 mL/min (ref >60)
GFR calc Af Amer: >60 mL/min (ref >60)
GFR calc non Af Amer: >60 mL/min (ref >60)
GFR calc non Af Amer: >60 mL/min (ref >60)
Glucose, Bld: 117 mg/dL — ABNORMAL HIGH (ref 70–99)
Glucose, Bld: 89 mg/dL (ref 70–99)
Potassium: 3.6 mmol/L (ref 3.5–5.1)
Potassium: 2.8 mmol/L — ABNORMAL LOW (ref 3.5–5.1)
Sodium: 142 mmol/L (ref 135–145)
Sodium: 145 mmol/L (ref 135–145)
Total Bilirubin: 0.6 mg/dL (ref 0.3–1.2)
Total Bilirubin: 0.5 mg/dL (ref 0.3–1.2)
Total Protein: 7.9 g/dL (ref 6.5–8.1)
Total Protein: 5.7 g/dL — ABNORMAL LOW (ref 6.5–8.1)

## 2020-03-06 LAB — URINALYSIS, ROUTINE W REFLEX MICROSCOPIC
Bilirubin Urine: NEGATIVE
Glucose, UA: NEGATIVE mg/dL
Hgb urine dipstick: NEGATIVE
Ketones, ur: NEGATIVE mg/dL
Nitrite: POSITIVE — AB
Protein, ur: NEGATIVE mg/dL
Specific Gravity, Urine: 1.019 (ref 1.005–1.030)
WBC, UA: >50 WBC/hpf — ABNORMAL HIGH (ref 0–5)
pH: 5 (ref 5.0–8.0)

## 2020-03-06 LAB — APTT: aPTT: 27 seconds (ref 24–36)

## 2020-03-06 LAB — LACTIC ACID, PLASMA
Lactic Acid, Venous: 2.4 mmol/L (ref 0.5–1.9)
Lactic Acid, Venous: 2.6 mmol/L (ref 0.5–1.9)

## 2020-03-06 LAB — PROTIME-INR
INR: 1.1 (ref 0.8–1.2)
Prothrombin Time: 14.5 seconds (ref 11.4–15.2)

## 2020-03-06 LAB — MAGNESIUM: Magnesium: 1.5 mg/dL — ABNORMAL LOW (ref 1.7–2.4)

## 2020-03-06 LAB — SARS CORONAVIRUS 2 (TAT 6-24 HRS): SARS Coronavirus 2: NEGATIVE

## 2020-03-06 MED ORDER — SODIUM CHLORIDE 0.9% FLUSH
3.0000 mL | Freq: Two times a day (BID) | INTRAVENOUS | Status: DC
  Administered 2020-03-06 – 2020-03-09 (×3): 3 mL via INTRAVENOUS

## 2020-03-06 MED ORDER — ENOXAPARIN SODIUM 40 MG/0.4ML ~~LOC~~ SOLN
40.0000 mg | SUBCUTANEOUS | Status: DC
  Administered 2020-03-06 – 2020-03-08 (×3): 40 mg via SUBCUTANEOUS
  Filled 2020-03-06 (×3): qty 0.4

## 2020-03-06 MED ORDER — ONDANSETRON HCL 4 MG/2ML IJ SOLN: 4.0000 mg | Freq: Four times a day (QID) | INTRAMUSCULAR | Status: DC | PRN

## 2020-03-06 MED ORDER — SODIUM CHLORIDE 0.9 % IV SOLN: INTRAVENOUS | Status: AC

## 2020-03-06 MED ORDER — ACETAMINOPHEN 650 MG RE SUPP: 650.0000 mg | Freq: Four times a day (QID) | RECTAL | Status: DC | PRN

## 2020-03-06 MED ORDER — SODIUM CHLORIDE 0.9 % IV SOLN: INTRAVENOUS | Status: DC

## 2020-03-06 MED ORDER — POTASSIUM CHLORIDE CRYS ER 20 MEQ PO TBCR
40.0000 meq | EXTENDED_RELEASE_TABLET | Freq: Once | ORAL | Status: DC
  Filled 2020-03-06: qty 2

## 2020-03-06 MED ORDER — POTASSIUM CHLORIDE 10 MEQ/100ML IV SOLN
10.0000 meq | INTRAVENOUS | Status: AC
  Administered 2020-03-06 (×4): 10 meq via INTRAVENOUS
  Filled 2020-03-06 (×5): qty 100

## 2020-03-06 MED ORDER — ONDANSETRON HCL 4 MG PO TABS: 4.0000 mg | ORAL_TABLET | Freq: Four times a day (QID) | ORAL | Status: DC | PRN

## 2020-03-06 MED ORDER — SODIUM CHLORIDE 0.9 % IV SOLN
1.0000 g | INTRAVENOUS | Status: DC
  Administered 2020-03-06 – 2020-03-07 (×2): 1 g via INTRAVENOUS
  Filled 2020-03-06 (×2): qty 1
  Filled 2020-03-06: qty 10

## 2020-03-06 MED ORDER — BISACODYL 10 MG RE SUPP: 10.0000 mg | Freq: Every day | RECTAL | Status: DC | PRN

## 2020-03-06 MED ORDER — ACETAMINOPHEN 325 MG PO TABS: 650.0000 mg | ORAL_TABLET | Freq: Four times a day (QID) | ORAL | Status: DC | PRN

## 2020-03-06 MED ORDER — SODIUM CHLORIDE 0.9 % IV SOLN
1.0000 g | Freq: Once | INTRAVENOUS | Status: AC
  Administered 2020-03-06: 1 g via INTRAVENOUS
  Filled 2020-03-06: qty 10

## 2020-03-06 MED ORDER — CALCITONIN (SALMON) 200 UNIT/ML IJ SOLN
270.0000 [IU] | Freq: Two times a day (BID) | INTRAMUSCULAR | Status: AC
  Administered 2020-03-06 – 2020-03-07 (×2): 270 [IU] via INTRAMUSCULAR
  Filled 2020-03-06 (×4): qty 1.35

## 2020-03-06 MED ORDER — ZOLEDRONIC ACID 4 MG/5ML IV CONC
4.0000 mg | Freq: Once | INTRAVENOUS | Status: AC
  Administered 2020-03-06: 4 mg via INTRAVENOUS
  Filled 2020-03-06: qty 5

## 2020-03-06 NOTE — Progress Notes (Signed)
Notified bedside nurse of need to draw lactic acid and blood cultures.  

## 2020-03-06 NOTE — Progress Notes (Signed)
Patient is 86 year old male with history of hypertension, former smoker, Right lung non-small cell cancer being treated with radiation who presents from home with progressive lethargy and confusion.  He was noted to have generalized weakness, confusion starting about a week ago.  He lives with his wife and walks with the help of walker.   Upon presentation, he was confused, was in sinus tachycardia in the range of 130s.  Blood pressure was stable.  Chest x-ray did not show pneumonia.  BMP showed calcium of 14.8.  Lactic acid was 2.4.  Urinalysis was positive for UTI.  He was started on antibiotics, culture sent.  Given a dose of Zometa and started on calcitonin, IV fluids. CT head did not show any acute intracranial abnormalities. His hypercalcemia is most likely associated  with dehydration, immobility and also cud be due to his lung cancer.  PTH is pending.  Calcium level is already improving with current regimen.  He has been started on ceftriaxone for possible UTI.  Blood cultures will be followed.  Urine culture on 03/03/2020 showed significant pansensitive E. Coli. He follows with radiation oncology for his lung cancer.  Status post radiation treatment.  There isplan to follow-up with medical oncology for possible immunotherapy as per radiation oncology note.  Chest x-ray did not show any acute findings. Patient seen and examined at the bedside this morning in the emergency department.  Currently he is hemodynamically stable.  Looks weak, chronically ill looking.  He is alert, awake and knows he is in [place], tells current month.  Denies any specific complaints.  He looks dehydrated, has suprapubic tenderness.  Lungs are clear to auscultation. Patient seen by Dr. Myna Hidalgo this morning.  I agree with assessment and plan. I have requested PT/OT evaluation.  I called the phone number of significant other on file, call not received

## 2020-03-06 NOTE — Progress Notes (Addendum)
TOC CM arranged [place] with [facility]. Notified [facility] rep, Ronalee Belts of hospital admission.  Pt maybe more appropriate for SNF rehab vs [facility]. TOC CM/CSW will continue to follow for dc needs. Will need PT evaluation. [place], [place] ED TOC CM [phone]

## 2020-03-06 NOTE — H&P (Signed)
History and Physical    Frank [name] FXT:[id number] DOB: Jan 20, 1928 DOA: 03/05/2020  PCP: Seward Carol, MD   Patient coming from: Home   Chief Complaint: Lethargy, confusion   HPI: Frank [name] is a 86 y.o. male with medical history significant for hypertension, former smoker, and lung cancer treated with radiation, now presenting to the emergency department with progressive lethargy and confusion.  Patient's wife provides most of the history by phone.  5 or 6 days ago, he was noted to have generalized weakness and some confusion.  He was particularly confused in the evenings, becoming agitated at times, having difficulty ambulating, and then progressively lethargic over the past 48 hours.  He has not expressed any specific complaints.  There has not been any falls since he was evaluated in the ED 3 days ago.  Patient has been seen in the emergency department on 03/03/2020 with generalized weakness and fall, had calcium of 14.2 at that time, urine was cultured and is now growing >100,000 colonies/mL of pansensitive E. coli, patient was able to ambulate with a walker at that time, and went home with plan for home health.  ED Course: Upon arrival to the ED, patient is found to be afebrile, saturating mid 90s on room air, tachycardic to the 130s, and with stable blood pressure.  EKG features sinus tachycardia with rate 135 and PVC.  Chest x-ray is negative for acute cardiopulmonary disease.  Chemistry panel notable for calcium of 14.8 with albumin 3.0.  CBC with stable macrocytic anemia.  Lactic acid elevated to 2.4.  Urinalysis compatible with infection.  Blood and urine cultures were collected, 30 cc/kg lactated Ringer's was administered, and the patient was started on Rocephin.  Covid PCR screening test is in process.  Review of Systems:  Unable to complete ROS secondary the patient's clinical condition.  Past Medical History:  Diagnosis Date  . Cancer ([place])   . High cholesterol   .  Hypertension     Past Surgical History:  Procedure Laterality Date  . BACK SURGERY    . HERNIA REPAIR    . JOINT REPLACEMENT       reports that he quit smoking about 16 years ago. He has never used smokeless tobacco. He reports current alcohol use. He reports that he does not use drugs.  No Known Allergies  History reviewed. No pertinent family history.   Prior to Admission medications   Medication Sig Start Date End Date Taking? Authorizing Provider  omeprazole (PRILOSEC) 20 MG capsule Take 20 mg by mouth daily.   Yes [provider]  pravastatin (PRAVACHOL) 40 MG tablet Take 40 mg by mouth every evening.   Yes [provider]  atenolol (TENORMIN) 50 MG tablet Take 25 mg by mouth daily.    [provider]  hydrochlorothiazide (HYDRODIURIL) 25 MG tablet Take 12.5 mg by mouth daily.    [provider]    Physical Exam: Vitals:   03/05/20 2334 03/06/20 0008 03/06/20 0146 03/06/20 0300  BP: 115/80  (!) 156/102 (!) 161/101  Pulse: (!) 136   (!) 111  Resp: (!) 23  17 16   Temp: 98.2 F (36.8 C) 98.3 F (36.8 C)    TempSrc: Oral Rectal    SpO2: 96%  97% 96%  Weight:    68.7 kg    Constitutional: NAD, calm  Eyes: PERTLA, lids and conjunctivae normal ENMT: Mucous membranes are moist. Posterior pharynx clear of any exudate or lesions.   Neck: normal, supple, no  masses, no thyromegaly Respiratory: no wheezing, no crackles. No accessory muscle use.  Cardiovascular: Rate ~120 and regular. No extremity edema.  Abdomen: No distension, soft, suprapubic tenderness. Bowel sounds active.  Musculoskeletal: no clubbing / cyanosis. No joint deformity upper and lower extremities.   Skin: no significant rashes, lesions, ulcers. Warm, dry, well-perfused. Neurologic: No gross facial asymmetry. No dysarthria. Sensation intact. Moving all extremities.  Psychiatric: Lethargic, slow and inconsistent with answers to basic questions but knows name, where he is,  and asked that I call his wife. Cooperative.    Labs and Imaging on Admission: I have personally reviewed following labs and imaging studies  CBC: Recent Labs  Lab 03/03/20 0836 03/06/20 0026  WBC 6.6 7.4  NEUTROABS 5.5 6.2  HGB 9.0* 10.2*  HCT 29.5* 33.0*  MCV 104.6* 104.4*  PLT 270 237   Basic Metabolic Panel: Recent Labs  Lab 03/03/20 0836 03/06/20 0026  NA 140 142  K 3.9 3.6  CL 101 108  CO2 30 27  GLUCOSE 123* 117*  BUN 22 22  CREATININE 1.13 1.06  CALCIUM 14.2* 14.8*   GFR: Estimated Creatinine Clearance: 45.9 mL/min (by C-G formula based on SCr of 1.06 mg/dL). Liver Function Tests: Recent Labs  Lab 03/03/20 0836 03/06/20 0026  AST 31 34  ALT 18 23  ALKPHOS 70 70  BILITOT 0.7 0.6  PROT 7.7 7.9  ALBUMIN 3.1* 3.0*   No results for input(s): LIPASE, AMYLASE in the last 168 hours. No results for input(s): AMMONIA in the last 168 hours. Coagulation Profile: Recent Labs  Lab 03/06/20 0025  INR 1.1   Cardiac Enzymes: No results for input(s): CKTOTAL, CKMB, CKMBINDEX, TROPONINI in the last 168 hours. BNP (last 3 results) No results for input(s): PROBNP in the last 8760 hours. HbA1C: No results for input(s): HGBA1C in the last 72 hours. CBG: No results for input(s): GLUCAP in the last 168 hours. Lipid Profile: No results for input(s): CHOL, HDL, LDLCALC, TRIG, CHOLHDL, LDLDIRECT in the last 72 hours. Thyroid Function Tests: No results for input(s): TSH, T4TOTAL, FREET4, T3FREE, THYROIDAB in the last 72 hours. Anemia Panel: No results for input(s): VITAMINB12, FOLATE, FERRITIN, TIBC, IRON, RETICCTPCT in the last 72 hours. Urine analysis:    Component Value Date/Time   COLORURINE YELLOW 03/05/2020 0030   APPEARANCEUR HAZY (A) 03/05/2020 0030   LABSPEC 1.019 03/05/2020 0030   PHURINE 5.0 03/05/2020 0030   GLUCOSEU NEGATIVE 03/05/2020 0030   HGBUR NEGATIVE 03/05/2020 0030   BILIRUBINUR NEGATIVE 03/05/2020 0030   KETONESUR NEGATIVE 03/05/2020 0030    PROTEINUR NEGATIVE 03/05/2020 0030   UROBILINOGEN 1.0 09/28/2008 1400   NITRITE POSITIVE (A) 03/05/2020 0030   LEUKOCYTESUR MODERATE (A) 03/05/2020 0030   Sepsis Labs: @LABRCNTIP (procalcitonin:4,lacticidven:4) ) Recent Results (from the past 240 hour(s))  Urine culture     Status: Abnormal   Collection Time: 03/03/20  8:36 AM   Specimen: Urine, Random  Result Value Ref Range Status   Specimen Description   Final    URINE, RANDOM Performed at [facility], [place] [address], [place], [place] [address]    Special Requests   Final    NONE Performed at [facility], [place] [address], [place], [place] [address]    Culture >=100,000 COLONIES/mL ESCHERICHIA COLI (A)  Final   Report Status 03/05/2020 FINAL  Final   Organism ID, Bacteria ESCHERICHIA COLI (A)  Final      Susceptibility   Escherichia coli - MIC*    AMPICILLIN <=2  SENSITIVE Sensitive     CEFAZOLIN <=4 SENSITIVE Sensitive     CEFTRIAXONE <=0.25 SENSITIVE Sensitive     CIPROFLOXACIN <=0.25 SENSITIVE Sensitive     GENTAMICIN <=1 SENSITIVE Sensitive     IMIPENEM <=0.25 SENSITIVE Sensitive     NITROFURANTOIN <=16 SENSITIVE Sensitive     TRIMETH/SULFA <=20 SENSITIVE Sensitive     AMPICILLIN/SULBACTAM <=2 SENSITIVE Sensitive     PIP/TAZO <=4 SENSITIVE Sensitive     * >=100,000 COLONIES/mL ESCHERICHIA COLI     Radiological Exams on Admission: DG Chest Port 1 View  Result Date: 03/06/2020 CLINICAL DATA:  Generalized weakness EXAM: PORTABLE CHEST 1 VIEW COMPARISON:  03/03/2020 FINDINGS: The heart size and mediastinal contours are within normal limits. Both lungs are clear. The visualized skeletal structures are unremarkable. Atherosclerotic changes are noted of the thoracic aorta. IMPRESSION: No active disease. Electronically Signed   By: Constance Holster M.D.   On: 03/06/2020 01:29    EKG: Independently reviewed. Sinus tachycardia (rate 135), PVC.   Assessment/Plan   1. Acute  encephalopathy  - Presents with progressive confusion, generalized weakness, and lethargy over the past 5-6 days and is found to have UTI and critical hypercalcemia  - There were no acute findings on head CT when he was seen for same on 03/03/20  - Anticipate improvement with treatment of UTI and hypercalcemia as below; will need to extend workup if he fails to improve significantly in the next 24-48 hours as expected   2. Hypercalcemia  - Presents with lethargy and confusion and is found to have calcium of 14.8, corrected to 16.0 when accounting for hypoalbuminemia  - He has been fluid-resuscitated in ED with LR  - Start calcitonin and zoledronic acid x1, continue IVF hydration, check PTH, repeat calcium in a few hours    3. UTI  - Urine was cultured on 03/03/20 when seen in ED for confusion and generalized weakness and has since grown >100k colonies/mL pan-sensitive E coli  - Blood cultures collected in ED and Rocephin started  - Continue Rocephin    4. Lung cancer  - Underwent radiation treatment, due to see radiation oncologist again this month  - No acute findings on CXR in ED     DVT prophylaxis: Lovenox  Code Status: DNR; confirmed with wife that he has consistently told her that he would not want to be resuscitated  Family Communication: Wife updated by phone  Disposition Plan: Likely back home in ~3 days if mental status and strength improve significantly with treatment of hypercalcemia and UTI  Consults called: None  Admission status: Inpatient     Vianne Bulls, MD [facility] Pager: See [email or web address]  If 7AM-7PM, please contact the daytime attending [email or web address]  03/06/2020, 3:43 AM

## 2020-03-06 NOTE — ED Provider Notes (Signed)
[facility] DEPT Provider Note   CSN: [id number] Arrival date & time: 03/05/20  2315     History Chief Complaint  Patient presents with  . Weakness  . Altered Mental Status    Frank [name] is a 86 y.o. male.  HPI     This is an 86 year old male with a history of lung cancer, hypertension, hypercholesterolemia who presents with generalized weakness and altered mental status.  Per report, he was seen and evaluated on April 4 for the same.  Per EMS, wife reports increasing inability to walk and more confusion.  Patient is technically alert and oriented x3 but is slow to answer questions.  He reports that he just does not feel good.  Denies any specific pain including chest pain or shortness of breath.  States that he normally has weakness on the left side.  Patient was seen and evaluated on April 4.  Had full extensive work-up.  Noted to have some hypercalcemia.  Also urine appeared dirty but he was otherwise asymptomatic.  This was cultured.  He was offered admission but declined.  I have reviewed the urine culture from that day and it showed greater than 100,000 colonies of E. coli which was pan susceptible.  Past Medical History:  Diagnosis Date  . Cancer ([place])   . High cholesterol   . Hypertension     Patient Active Problem List   Diagnosis Date Noted  . Primary cancer of right upper lobe of lung ([place]) 09/05/2019    Past Surgical History:  Procedure Laterality Date  . BACK SURGERY    . HERNIA REPAIR    . JOINT REPLACEMENT         History reviewed. No pertinent family history.  Social History   Tobacco Use  . Smoking status: Never Smoker  Substance Use Topics  . Alcohol use: Yes    Comment: drinks a thimble of brandy with coffee every morning  . Drug use: No    Home Medications Prior to Admission medications   Medication Sig Start Date End Date Taking? Authorizing Provider  omeprazole (PRILOSEC) 20 MG capsule Take 20 mg by  mouth daily.   Yes [provider]  pravastatin (PRAVACHOL) 40 MG tablet Take 40 mg by mouth every evening.   Yes [provider]  atenolol (TENORMIN) 50 MG tablet Take 25 mg by mouth daily.    [provider]  hydrochlorothiazide (HYDRODIURIL) 25 MG tablet Take 12.5 mg by mouth daily.    [provider]    Allergies    Patient has no known allergies.  Review of Systems   Review of Systems  Constitutional: Positive for fatigue. Negative for fever.  Respiratory: Negative for shortness of breath.   Cardiovascular: Negative for chest pain.  Gastrointestinal: Negative for abdominal pain, nausea and vomiting.  Genitourinary: Negative for dysuria.  Neurological: Positive for weakness.  Psychiatric/Behavioral: Positive for confusion.  All other systems reviewed and are negative.   Physical Exam Updated Vital Signs BP (!) 161/101   Pulse (!) 111   Temp 98.3 F (36.8 C) (Rectal)   Resp 16   SpO2 96%   Physical Exam Vitals and nursing note reviewed.  Constitutional:      General: He is not in acute distress.    Appearance: He is well-developed. He is ill-appearing.  HENT:     Head: Normocephalic and atraumatic.     Mouth/Throat:     Mouth: Mucous membranes are dry.  Eyes:  Pupils: Pupils are equal, round, and reactive to light.  Cardiovascular:     Rate and Rhythm: Regular rhythm. Tachycardia present.     Heart sounds: Normal heart sounds. No murmur.  Pulmonary:     Effort: Pulmonary effort is normal. No respiratory distress.     Breath sounds: Normal breath sounds. No wheezing.  Abdominal:     General: Bowel sounds are normal.     Palpations: Abdomen is soft.     Tenderness: There is no abdominal tenderness. There is no rebound.  Musculoskeletal:        General: No swelling.     Cervical back: Neck supple.     Right lower leg: No edema.     Left lower leg: No edema.  Lymphadenopathy:     Cervical: No cervical adenopathy.  Skin:     General: Skin is warm and dry.  Neurological:     Mental Status: He is alert and oriented to person, place, and time.     Comments: Generally weak, 4+ out of 5 strength left upper and lower extremity, 5/5 strength otherwise, cranial nerves II through XII intact, speech fluent  Psychiatric:     Comments: Flat affect     ED Results / Procedures / Treatments   Labs (all labs ordered are listed, but only abnormal results are displayed) Labs Reviewed  CBC WITH DIFFERENTIAL/PLATELET - Abnormal; Notable for the following components:      Result Value   RBC 3.16 (*)    Hemoglobin 10.2 (*)    HCT 33.0 (*)    MCV 104.4 (*)    Lymphs Abs 0.3 (*)    All other components within normal limits  COMPREHENSIVE METABOLIC PANEL - Abnormal; Notable for the following components:   Glucose, Bld 117 (*)    Calcium 14.8 (*)    Albumin 3.0 (*)    All other components within normal limits  URINALYSIS, ROUTINE W REFLEX MICROSCOPIC - Abnormal; Notable for the following components:   APPearance HAZY (*)    Nitrite POSITIVE (*)    Leukocytes,Ua MODERATE (*)    WBC, UA >50 (*)    Bacteria, UA MANY (*)    All other components within normal limits  LACTIC ACID, PLASMA - Abnormal; Notable for the following components:   Lactic Acid, Venous 2.4 (*)    All other components within normal limits  CULTURE, BLOOD (ROUTINE X 2)  CULTURE, BLOOD (ROUTINE X 2)  URINE CULTURE  SARS CORONAVIRUS 2 (TAT 6-24 HRS)  APTT  PROTIME-INR  LACTIC ACID, PLASMA    EKG EKG Interpretation  Date/Time:  Tuesday March 05 2020 23:33:53 EDT Ventricular Rate:  135 PR Interval:    QRS Duration: 80 QT Interval:  283 QTC Calculation: 425 R Axis:   56 Text Interpretation: Sinus tachycardia Ventricular premature complex Aberrant conduction of SV complex(es) Repolarization abnormality, prob rate related Confirmed by Thayer Jew [phone]) on 03/06/2020 12:23:42 AM   Radiology DG Chest Port 1 View  Result Date: 03/06/2020  CLINICAL DATA:  Generalized weakness EXAM: PORTABLE CHEST 1 VIEW COMPARISON:  03/03/2020 FINDINGS: The heart size and mediastinal contours are within normal limits. Both lungs are clear. The visualized skeletal structures are unremarkable. Atherosclerotic changes are noted of the thoracic aorta. IMPRESSION: No active disease. Electronically Signed   By: Constance Holster M.D.   On: 03/06/2020 01:29    Procedures Procedures (including critical care time)  CRITICAL CARE Performed by: Merryl Hacker   Total critical care time: 40 minutes  Critical care time was exclusive of separately billable procedures and treating other patients.  Critical care was necessary to treat or prevent imminent or life-threatening deterioration.  Critical care was time spent personally by me on the following activities: development of treatment plan with patient and/or surrogate as well as nursing, discussions with consultants, evaluation of patient's response to treatment, examination of patient, obtaining history from patient or surrogate, ordering and performing treatments and interventions, ordering and review of laboratory studies, ordering and review of radiographic studies, pulse oximetry and re-evaluation of patient's condition.   Medications Ordered in ED Medications  lactated ringers bolus 1,000 mL (0 mLs Intravenous Stopped 03/06/20 0234)    And  lactated ringers bolus 1,000 mL (0 mLs Intravenous Stopped 03/06/20 0234)    And  lactated ringers bolus 250 mL (250 mLs Intravenous New Bag/Given 03/06/20 0149)  cefTRIAXone (ROCEPHIN) 1 g in sodium chloride 0.9 % 100 mL IVPB (0 g Intravenous Stopped 03/06/20 0110)    ED Course  I have reviewed the triage vital signs and the nursing notes.  Pertinent labs & imaging results that were available during my care of the patient were reviewed by me and considered in my medical decision making (see chart for details).    MDM Rules/Calculators/A&P                        Patient presents with continued altered mental status and decreased ADLs.  Reportedly increasing weakness over the last several days.  He is overall nontoxic-appearing but he does seem intermittently confused although technically oriented.  No focal deficits on exam.  Initially significantly tachycardic into the 130s.  Blood pressure normal.  I have reviewed his chart from the other day.  He did have a positive urine culture with greater than 100,000 E. coli that was pansensitive.  Suspect he may have an acute infection or early sepsis related to UTI.  Patient was given 30 cc/kg fluid.  He was given 1 g of Rocephin.  Lab work notable for persistent hypercalcemia.  Lactate is 2.4.  He was never hypotensive.  No significant leukocytosis.  He does not appear to be in septic shock but more severe sepsis with altered mental status.  We will plan for admission.  On recheck, his pulse rate improved to the 110s.  EKG without ischemic or arrhythmic changes.  Will discuss with the admitting hospitalist.  Final Clinical Impression(s) / ED Diagnoses Final diagnoses:  Acute cystitis without hematuria  Altered mental status, unspecified altered mental status type  Severe sepsis [facility])    Rx / DC Orders ED Discharge Orders    None       Merryl Hacker, MD 03/06/20 [phone]

## 2020-03-07 DIAGNOSIS — C3411 Malignant neoplasm of upper lobe, right bronchus or lung: Secondary | ICD-10-CM | POA: Diagnosis not present

## 2020-03-07 DIAGNOSIS — G9340 Encephalopathy, unspecified: Secondary | ICD-10-CM | POA: Diagnosis not present

## 2020-03-07 DIAGNOSIS — Z7189 Other specified counseling: Secondary | ICD-10-CM

## 2020-03-07 DIAGNOSIS — Z515 Encounter for palliative care: Secondary | ICD-10-CM

## 2020-03-07 LAB — CBC WITH DIFFERENTIAL/PLATELET
Abs Immature Granulocytes: 0.03 10*3/uL (ref 0.00–0.07)
Basophils Absolute: 0 10*3/uL (ref 0.0–0.1)
Basophils Relative: 0 %
Eosinophils Absolute: 0 10*3/uL (ref 0.0–0.5)
Eosinophils Relative: 0 %
HCT: 35.1 % — ABNORMAL LOW (ref 39.0–52.0)
Hemoglobin: 10.4 g/dL — ABNORMAL LOW (ref 13.0–17.0)
Immature Granulocytes: 0 %
Lymphocytes Relative: 4 %
Lymphs Abs: 0.3 10*3/uL — ABNORMAL LOW (ref 0.7–4.0)
MCH: 31.6 pg (ref 26.0–34.0)
MCHC: 29.6 g/dL — ABNORMAL LOW (ref 30.0–36.0)
MCV: 106.7 fL — ABNORMAL HIGH (ref 80.0–100.0)
Monocytes Absolute: 0.8 10*3/uL (ref 0.1–1.0)
Monocytes Relative: 10 %
Neutro Abs: 6.8 10*3/uL (ref 1.7–7.7)
Neutrophils Relative %: 86 %
Platelets: 283 10*3/uL (ref 150–400)
RBC: 3.29 MIL/uL — ABNORMAL LOW (ref 4.22–5.81)
RDW: 13.4 % (ref 11.5–15.5)
WBC: 8 10*3/uL (ref 4.0–10.5)
nRBC: 0 % (ref 0.0–0.2)

## 2020-03-07 LAB — BASIC METABOLIC PANEL
Anion gap: 9 (ref 5–15)
BUN: 14 mg/dL (ref 8–23)
CO2: 26 mmol/L (ref 22–32)
Calcium: 11.7 mg/dL — ABNORMAL HIGH (ref 8.9–10.3)
Chloride: 108 mmol/L (ref 98–111)
Creatinine, Ser: 0.86 mg/dL (ref 0.61–1.24)
GFR calc Af Amer: >60 mL/min (ref >60)
GFR calc non Af Amer: >60 mL/min (ref >60)
Glucose, Bld: 78 mg/dL (ref 70–99)
Potassium: 3.8 mmol/L (ref 3.5–5.1)
Sodium: 143 mmol/L (ref 135–145)

## 2020-03-07 LAB — PTH, INTACT AND CALCIUM
Calcium, Total (PTH): 15.5 mg/dL (ref 8.6–10.2)
PTH: 6 pg/mL — ABNORMAL LOW (ref 15–65)

## 2020-03-07 LAB — LACTIC ACID, PLASMA: Lactic Acid, Venous: 1.8 mmol/L (ref 0.5–1.9)

## 2020-03-07 MED ORDER — ATENOLOL 25 MG PO TABS: 25.0000 mg | ORAL_TABLET | Freq: Every day | ORAL | Status: DC

## 2020-03-07 MED ORDER — SODIUM CHLORIDE 0.9 % IV SOLN: INTRAVENOUS | Status: DC

## 2020-03-07 MED ORDER — BOOST / RESOURCE BREEZE PO LIQD CUSTOM: 1.0000 | Freq: Three times a day (TID) | ORAL | Status: DC

## 2020-03-07 MED ORDER — PRO-STAT SUGAR FREE PO LIQD: 30.0000 mL | Freq: Two times a day (BID) | ORAL | Status: DC

## 2020-03-07 MED ORDER — LORAZEPAM 2 MG/ML IJ SOLN
1.0000 mg | Freq: Once | INTRAMUSCULAR | Status: AC
  Administered 2020-03-07: 1 mg via INTRAVENOUS
  Filled 2020-03-07: qty 1

## 2020-03-07 MED ORDER — METOPROLOL TARTRATE 5 MG/5ML IV SOLN
5.0000 mg | INTRAVENOUS | Status: AC | PRN
  Administered 2020-03-07: 5 mg via INTRAVENOUS
  Filled 2020-03-07: qty 5

## 2020-03-07 NOTE — Evaluation (Signed)
Occupational Therapy Evaluation Patient Details Name: Frank [name] MRN: [id number] DOB: 1928/06/14 Today's Date: 03/07/2020    History of Present Illness Patient is 86 year old male with history of hypertension, former smoker, Right lung non-small cell cancer being treated with radiation who presents from home with progressive lethargy and confusion.  He was noted to have generalized weakness, confusion starting about a week ago.  He lives with his girlfriend and walks with the help of walker.   Clinical Impression   Pt admitted with the above. Pt currently with functional limitations due to the deficits listed below (see OT Problem List). Pt able to verbalize needing to have bowel movement and wanting to lay back down, but unable to initiate transfer to [facility] or return to supine. Therapist tried multiple times to have pt follow finger but pt unable to attend, looking off in space with erratic eye movement, occasionally holding breath; RN notified of abnormal behaviors. Per reports from pt's friend/roommate, pt appears to be significantly below baseline and requires mod assist x2 for all mobility Pt will benefit from skilled OT to increase their safety and independence with ADL and functional mobility for ADL to facilitate discharge to venue listed below.      Follow Up Recommendations  SNF    Equipment Recommendations  None recommended by OT    Recommendations for Other Services       Precautions / Restrictions Precautions Precautions: Fall      Mobility Bed Mobility Overal bed mobility: Needs Assistance Bed Mobility: Supine to Sit;Sit to Supine     Supine to sit: Mod assist;+2 for physical assistance Sit to supine: Mod assist;+2 for physical assistance   General bed mobility comments: pt requires initiation cues for goal of task, moderate assist to bring BLE to EOB and to upright trunk unto sitting, difficulty attending to task to assist despite constant verbal  cues  Transfers Overall transfer level: Needs assistance Equipment used: Rolling walker (2 wheeled) Transfers: Sit to/from Stand Sit to Stand: Mod assist;+2 physical assistance         General transfer comment: pt requires initiation cues for goal of task, moderate assist x2 to power up from seated surface, hip extension after knee extension with kyphotic trunk posture    Balance Overall balance assessment: Needs assistance Sitting-balance support: Feet supported;Bilateral upper extremity supported Sitting balance-Leahy Scale: Fair Sitting balance - Comments: initial lean posterior, but able to maintain midline after time   Standing balance support: During functional activity;Bilateral upper extremity supported Standing balance-Leahy Scale: Poor Standing balance comment: with RW, ~30 sec with flexed trunk posture                           ADL either performed or assessed with clinical judgement   ADL Overall ADL's : Needs assistance/impaired Eating/Feeding: Set up;Sitting   Grooming: Maximal assistance;Sitting                   Toilet Transfer: Moderate assistance;+2 for physical assistance;+2 for safety/equipment;With caregiver independent assisting;Cueing for safety;Cueing for sequencing;Stand-pivot;RW   Toileting- Clothing Manipulation and Hygiene: Total assistance;Sit to/from stand;Cueing for compensatory techniques;Cueing for safety;Cueing for sequencing         General ADL Comments: pt will need significant A with ADL activity.  Pt wil need SNF based on evaluation.     Vision Patient Visual Report: Other (comment)(will need further vision assessment) Vision Assessment?: Vision impaired- to be further tested in functional context Additional Comments:  pt had trouble focusing on therapist.  Pt did close one eye but unable to verbalize ifhe was having double vision.            Pertinent Vitals/Pain Pain Assessment: Faces Pain Score: 0-No  pain Faces Pain Scale: No hurt     Hand Dominance     Extremity/Trunk Assessment Upper Extremity Assessment Upper Extremity Assessment: Generalized weakness   Lower Extremity Assessment Lower Extremity Assessment: Generalized weakness;Difficult to assess due to impaired cognition(BLE AROM WNL)   Cervical / Trunk Assessment Cervical / Trunk Assessment: Kyphotic   Communication Communication Communication: Receptive difficulties;Expressive difficulties   Cognition Arousal/Alertness: Lethargic Behavior During Therapy: Flat affect Overall Cognitive Status: Impaired/Different from baseline Area of Impairment: Orientation;Attention;Following commands                 Orientation Level: Person Current Attention Level: Divided   Following Commands: Follows one step commands inconsistently       General Comments: Pt unable to attend to task despite verbal and tactile cues, stares into space with erratic eye movement not focusing on any particular object, inconsistently follows simple commands, appears to hold breath occasionally, but able to tell therapists he needs to have bowel movement and would like to lay back down in bed.              Home Living Family/patient expects to be discharged to:: Private residence Living Arrangements: Spouse/significant other Available Help at Discharge: Friend(s);Available PRN/intermittently Type of Home: House Home Access: Stairs to enter [employer] of Steps: 1-2 Entrance Stairs-Rails: Left Home Layout: One level               Home Equipment: Walker - 2 wheels;Cane - single point   Additional Comments: Pt's friend Frank [name], physiological) provides information regarding PLOF. Friend reports they got back and forth between her house and his house and the pt's children are not involved in his life.      Prior Functioning/Environment Level of Independence: Independent with assistive device(s)        Comments: Friend reports he  ambulates independently with SPC in the community, independent with ADLs, and driving and recent decline ~10 days requiring total assist with care.        OT Problem List: Decreased strength;Decreased activity tolerance;Impaired balance (sitting and/or standing);Decreased safety awareness;Decreased knowledge of use of DME or AE;Decreased knowledge of precautions      OT Treatment/Interventions: Self-care/ADL training;Patient/family education;DME and/or AE instruction;Therapeutic activities    OT Goals(Current goals can be found in the care plan section) Acute Rehab OT Goals Patient Stated Goal: did not state OT Goal Formulation: With patient Time For Goal Achievement: 03/14/20 Potential to Achieve Goals: Good  OT Frequency: Min 2X/week   Barriers to D/C: Decreased caregiver support          Co-evaluation   Reason for Co-Treatment: Complexity of the patient's impairments (multi-system involvement);Necessary to address cognition/behavior during functional activity;For patient/therapist safety;To address functional/ADL transfers PT goals addressed during session: Mobility/safety with mobility;Balance;Proper use of DME;Strengthening/ROM        AM-PAC OT "6 Clicks" Daily Activity     Outcome Measure Help from another person eating meals?: A Lot Help from another person taking care of personal grooming?: A Lot Help from another person toileting, which includes using toliet, bedpan, or urinal?: Total Help from another person bathing (including washing, rinsing, drying)?: A Lot Help from another person to put on and taking off regular upper body clothing?: A Lot Help from another person to  put on and taking off regular lower body clothing?: Total 6 Click Score: 10   End of Session Equipment Utilized During Treatment: Rolling walker Nurse Communication: Mobility status  Activity Tolerance: Patient tolerated treatment well Patient left: in bed;with call bell/phone within reach;with  family/visitor present  OT Visit Diagnosis: Unsteadiness on feet (R26.81);Repeated falls (R29.6);Muscle weakness (generalized) (M62.81);History of falling (Z91.81)                Time: 1240-1304 OT Time Calculation (min): 24 min Charges:  OT General Charges $OT Visit: 1 Visit OT Evaluation $OT Eval Moderate Complexity: 1 Mod  Kari Baars, OT Acute Rehabilitation Services [phone] Office- [phone], Edwena Felty D 03/07/2020, 3:16 PM

## 2020-03-07 NOTE — Progress Notes (Signed)
Pt disoriented X 4. Yellow MEWS initiated. On call notified. Pt agitated pulling at lines. Mitten On. Will await for new orders and continue to monitor.   Today's Vitals   03/06/20 1818 03/06/20 2214 03/06/20 2237 03/07/20 0019  BP: 131/85 (!) 149/104  (!) 135/97  Pulse: (!) 108 (!) 115  (!) 123  Resp: 18 17  20   Temp: 97.6 F (36.4 C) 97.6 F (36.4 C)  (!) 97.5 F (36.4 C)  TempSrc: Axillary     SpO2: 99%   97%  Weight:      Height:      PainSc:   0-No pain    Body mass index is 21.12 kg/m.
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
## 2020-03-07 NOTE — TOC Initial Note (Signed)
Transition of Care [facility]) - Initial/Assessment Note    Patient Details  Name: Frank [name] MRN: [id number] Date of Birth: 22-Apr-1928  [facility]) CM/SW Contact:    Trish Mage, LCSW Phone Number: 03/07/2020, 10:55 AM  Clinical Narrative:  TOC responding to Dr consult order re: [facility] POA.  Patient in mitts, appearing confused, but able to identify daughter and gave me permission to speak with her.  She is interested in becoming [facility] POA, and is unsure if he already has one as she has been estranged from father after her mother died 3 years ago and father moved in with girlfriend.  She is concerned that girlfriend is no longer able to care for him, and wants to make sure he gets the care he needs.  I agreed to look in Epic for any evidence of [facility] POA paperwork.  Found none.  I asked Dr to put in order for chaplain re: [facility] POA follow up.  TOC will continue to follow during the course of hospitalization.                  Expected Discharge Plan: [place] Barriers to Discharge: No Barriers Identified   Patient Goals and CMS Choice        Expected Discharge Plan and Services Expected Discharge Plan: [place] In-house Referral: Clinical Social Work     Living arrangements for the past 2 months: [place]                                      Prior Living Arrangements/Services Living arrangements for the past 2 months: Single Family Home Lives with:: Significant Other Patient language and need for interpreter reviewed:: Yes        Need for Family Participation in Patient Care: Yes (Comment) Care giver support system in place?: Yes (comment)   Criminal Activity/Legal Involvement Pertinent to Current Situation/Hospitalization: No - Comment as needed  Activities of Daily Living Home Assistive Devices/Equipment: Environmental consultant (specify type)(rollator) ADL Screening (condition at time of admission) Patient's cognitive ability  adequate to safely complete daily activities?: No Is the patient deaf or have difficulty hearing?: Yes(hoh) Does the patient have difficulty seeing, even when wearing glasses/contacts?: No Does the patient have difficulty concentrating, remembering, or making decisions?: Yes Patient able to express need for assistance with ADLs?: No Does the patient have difficulty dressing or bathing?: Yes Independently performs ADLs?: No Communication: Independent Dressing (OT): Dependent Is this a change from baseline?: Change from baseline, expected to last >3 days Grooming: Needs assistance Is this a change from baseline?: Change from baseline, expected to last >3 days Feeding: Needs assistance Is this a change from baseline?: Change from baseline, expected to last >3 days Bathing: Needs assistance Is this a change from baseline?: Change from baseline, expected to last >3 days Toileting: Dependent Is this a change from baseline?: Change from baseline, expected to last >3days In/Out Bed: Dependent Is this a change from baseline?: Change from baseline, expected to last >3 days Walks in Home: Dependent Is this a change from baseline?: Change from baseline, expected to last >3 days Does the patient have difficulty walking or climbing stairs?: Yes(secondary to weakness) Weakness of Legs: Both Weakness of Arms/Hands: Both  Permission Sought/Granted Permission sought to share information with : Family Supports Permission granted to share information with : Yes, Verbal Permission  Granted  Share Information with NAME: Grayer Sproles     Permission granted to share info w Relationship: elder daughter  Permission granted to share info w Contact Information: [phone]  Emotional Assessment Appearance:: Appears stated age Attitude/Demeanor/Rapport: Unable to Assess Affect (typically observed): Unable to Assess Orientation: : Oriented to Self Alcohol / Substance Use: Not Applicable Psych  Involvement: No (comment)  Admission diagnosis:  Acute cystitis without hematuria [N30.00] Acute encephalopathy [G93.40] Severe sepsis ([place]) [A41.9, R65.20] Altered mental status, unspecified altered mental status type [R41.82] Patient Active Problem List   Diagnosis Date Noted  . Acute encephalopathy 03/06/2020  . Hypercalcemia 03/06/2020  . Macrocytic anemia 03/06/2020  . Acute lower UTI 03/06/2020  . Primary cancer of right upper lobe of lung ([place]) 09/05/2019   PCP:  Seward Carol, MD Pharmacy:   [facility] [address] [place] [address] Phone: [phone] Fax: [phone]     Social Determinants of Health (SDOH) Interventions    Readmission Risk Interventions No flowsheet data found.

## 2020-03-07 NOTE — Evaluation (Signed)
Physical Therapy Evaluation Patient Details Name: Frank [name] MRN: [id number] DOB: [birth year] Today's Date: 03/07/2020   History of Present Illness  Patient is 86 year old male with history of hypertension, former smoker, Right lung non-small cell cancer being treated with radiation who presents from home with progressive lethargy and confusion.  He was noted to have generalized weakness, confusion starting about a week ago.  He lives with his girlfriend and walks with the help of walker.    Clinical Impression  Pt admitted with above diagnosis. Pt difficult to engage during evaluation, some improvement in alertness with sitting EOB and standing, but unable to attend to task and follow commands consistently. Pt able to verbalize needing to have bowel movement and wanting to lay back down, but unable to initiate transfer to [facility] or return to supine. Therapist tried multiple times to have pt follow finger but pt unable to attend, looking off in space with erratic eye movement, occasionally holding breath; RN notified of abnormal behaviors. Per reports from pt's friend/roommate, pt appears to be significantly below baseline and requires mod assist x2 for all mobility. Pt currently with functional limitations due to the deficits listed below (see PT Problem List). Pt will benefit from skilled PT to increase their independence and safety with mobility to allow discharge to the venue listed below.        Follow Up Recommendations SNF    Equipment Recommendations  None recommended by PT    Recommendations for Other Services       Precautions / Restrictions        Mobility  Bed Mobility Overal bed mobility: Needs Assistance Bed Mobility: Supine to Sit;Sit to Supine     Supine to sit: Mod assist;+2 for physical assistance Sit to supine: Mod assist;+2 for physical assistance   General bed mobility comments: pt requires initiation cues for goal of task, moderate assist to bring BLE to EOB  and to upright trunk unto sitting, difficulty attending to task to assist despite constant verbal cues  Transfers Overall transfer level: Needs assistance Equipment used: Rolling walker (2 wheeled) Transfers: Sit to/from Stand Sit to Stand: Mod assist;+2 physical assistance         General transfer comment: pt requires initiation cues for goal of task, moderate assist x2 to power up from seated surface, hip extension after knee extension with kyphotic trunk posture  Ambulation/Gait      General Gait Details: unable due to cognition  Stairs         Wheelchair Mobility    Modified Rankin (Stroke Patients Only)       Balance Overall balance assessment: Needs assistance Sitting-balance support: Feet supported;Bilateral upper extremity supported Sitting balance-Leahy Scale: Fair Sitting balance - Comments: initial lean posterior, but able to maintain midline after time   Standing balance support: During functional activity;Bilateral upper extremity supported Standing balance-Leahy Scale: Poor Standing balance comment: with RW, ~30 sec with flexed trunk posture                  Pertinent Vitals/Pain Pain Assessment: Faces Faces Pain Scale: No hurt    Home Living Family/patient expects to be discharged to:: Private residence Living Arrangements: Spouse/significant other Available Help at Discharge: Friend(s);Available PRN/intermittently Type of Home: House Home Access: Stairs to enter Entrance Stairs-Rails: Left Entrance Stairs-Number of Steps: 1-2 Home Layout: One level Home Equipment: Walker - 2 wheels;Cane - single point Additional Comments: Pt's friend Scientist, physiological) provides information regarding PLOF. Friend reports they got back and forth between  her house and his house and the pt's children are not involved in his life.    Prior Function Level of Independence: Independent with assistive device(s)         Comments: Friend reports he ambulates  independently with SPC in the community, independent with ADLs, and driving and recent decline ~10 days requiring total assist with care.     Hand Dominance        Extremity/Trunk Assessment   Upper Extremity Assessment Upper Extremity Assessment: Defer to OT evaluation    Lower Extremity Assessment Lower Extremity Assessment: Generalized weakness;Difficult to assess due to impaired cognition(BLE AROM WNL)    Cervical / Trunk Assessment Cervical / Trunk Assessment: Kyphotic  Communication   Communication: Receptive difficulties;Expressive difficulties  Cognition Arousal/Alertness: Lethargic Behavior During Therapy: Flat affect Overall Cognitive Status: Impaired/Different from baseline Area of Impairment: Orientation;Attention;Following commands                 Orientation Level: Person Current Attention Level: Divided   Following Commands: Follows one step commands inconsistently       General Comments: Pt unable to attend to task despite verbal and tactile cues, stares into space with erratic eye movement not focusing on any particular object, inconsistently follows simple commands, appears to hold breath occasionally, but able to tell therapists he needs to have bowel movement and would like to lay back down in bed.      General Comments      Exercises     Assessment/Plan    PT Assessment Patient needs continued PT services  PT Problem List Decreased strength;Decreased activity tolerance;Decreased balance;Decreased mobility;Decreased coordination;Decreased cognition;Decreased knowledge of use of DME;Decreased safety awareness       PT Treatment Interventions DME instruction;Gait training;Functional mobility training;Therapeutic activities;Therapeutic exercise;Balance training;Neuromuscular re-education;Cognitive remediation;Patient/family education;Manual techniques    PT Goals (Current goals can be found in the Care Plan section)  Acute Rehab PT Goals PT  Goal Formulation: Patient unable to participate in goal setting Time For Goal Achievement: 03/21/20 Potential to Achieve Goals: Fair    Frequency Min 2X/week   Barriers to discharge        Co-evaluation PT/OT/SLP Co-Evaluation/Treatment: Yes Reason for Co-Treatment: Complexity of the patient's impairments (multi-system involvement);Necessary to address cognition/behavior during functional activity;For patient/therapist safety;To address functional/ADL transfers PT goals addressed during session: Mobility/safety with mobility;Balance;Proper use of DME;Strengthening/ROM         AM-PAC PT "6 Clicks" Mobility  Outcome Measure Help needed turning from your back to your side while in a flat bed without using bedrails?: A Lot Help needed moving from lying on your back to sitting on the side of a flat bed without using bedrails?: A Lot Help needed moving to and from a bed to a chair (including a wheelchair)?: A Lot Help needed standing up from a chair using your arms (e.g., wheelchair or bedside chair)?: A Lot Help needed to walk in hospital room?: Total Help needed climbing 3-5 steps with a railing? : Total 6 Click Score: 10    End of Session   Activity Tolerance: Patient limited by fatigue;Patient limited by lethargy Patient left: in bed;with call bell/phone within reach;with nursing/sitter in room Nurse Communication: Mobility status;Other (comment)(abnormal eye movement, inability to attend to task) PT Visit Diagnosis: Unsteadiness on feet (R26.81);Other abnormalities of gait and mobility (R26.89);Muscle weakness (generalized) (M62.81);Other symptoms and signs involving the nervous system (R29.898)    Time: 0623-7628 PT Time Calculation (min) (ACUTE ONLY): 28 min   Charges:   PT Evaluation $  PT Eval Moderate Complexity: 1 Mod           Tori Yoshua [facility] PT, DPT 03/07/20, 2:33 PM [phone]

## 2020-03-07 NOTE — Progress Notes (Signed)
Initial Nutrition Assessment  DOCUMENTATION CODES:   Not applicable  INTERVENTION:  Boost Breeze po TID, each supplement provides 250 kcal and 9 grams of protein  Prostat 30 ml po BID, each supplement provides 100 kcal and 15 grams of protein  Recommend monitoring magnesium, potassium, and phosphorus daily for at least 3 days, MD to replete as needed, as pt is at risk for refeeding syndrome given dehydrated noted on admission, low Mg per 4/7 labs.  NUTRITION DIAGNOSIS:   Increased nutrient needs related to cancer and cancer related treatments, acute illness(UTI; Right small cell lung cancer currently undergoing radiation therapy) as evidenced by estimated needs.   GOAL:   Patient will meet greater than or equal to 90% of their needs   MONITOR:   Supplement acceptance, PO intake, Diet advancement, Weight trends, Labs  REASON FOR ASSESSMENT:   Malnutrition Screening Tool    ASSESSMENT:    86 year old male with past medical history of HTN, lung cancer s/p RXT, recent ED discharge with home health on 4/4 with weakness and fall without injury, admitted for acute encephalopathy on 03/06/20 after presenting with progressive lethargy and confusion over the past 5-6 days. Wife of patient reports patient particularly confused in the evenings, becoming agitated at times, having difficulty ambulating and progressively lethargic over the last 48 hrs.  Unable to obtain nutrition history at this time, patient sleeping soundly at RD visit this morning. Per chart review, patient lives with his girlfriend and ambulates with a walker at baseline.   Current wt 140.8 lbs Per history, on 12/21/19 pt weighed 151.14 lbs, on 09/21/19 pt weighed 146.08 lbs. This indicates a 10.34 lb (6.8%) wt loss in 2.5 months which is significant. Given his advanced age, history of lung cancer currently undergoing radiation therapy, and acute encephalopathy he is at high risk for malnutrition. He is on a clear liquid  diet, no documented intakes this admission, will follow for diet advancement and provide Boost Breeze and prostat supplements to aid with needs.   Per notes: -Urinalysis positive for UTI -hypercalcemia improved -R non-small cell lung cancer, current XRT and possible immunotherapy per radiation oncology note  Medications reviewed and include: calcitonin, klor-con IVF: NaCl IVPB: Rocephin Labs: Ca 11.7 (H), Mg 1.5 (L)  NUTRITION - FOCUSED PHYSICAL EXAM: Unable to complete at this time.   Diet Order:   Diet Order            Diet clear liquid Room service appropriate? Yes; Fluid consistency: Thin  Diet effective now              EDUCATION NEEDS:   Not appropriate for education at this time  Skin:  Skin Assessment: Reviewed RN Assessment  Last BM:  4/6  Height:   Ht Readings from Last 1 Encounters:  03/06/20 5\' 11"  (1.803 m)    Weight:   Wt Readings from Last 1 Encounters:  03/07/20 64 kg    Ideal Body Weight:     BMI:  Body mass index is 19.69 kg/m.  Estimated Nutritional Needs:   Kcal:  2000-2250  Protein:  100-115  Fluid:  >/= 2 L/day   Lajuan Lines, RD, LDN Clinical Nutrition After Hours/Weekend Pager # in [place]

## 2020-03-07 NOTE — Progress Notes (Addendum)
PROGRESS NOTE    Frank [name]  ZOX:[id number] DOB: January 15, 1928 DOA: 03/05/2020 PCP: Seward Carol, MD   Brief Narrative: Patient is 86 year old male with history of hypertension, former smoker, Right lung non-small cell cancer being treated with radiation who presents from home with progressive lethargy and confusion.  He was noted to have generalized weakness, confusion starting about a week ago.  He lives with his girlfriend and walks with the help of walker.   Upon presentation, he was confused, was in sinus tachycardia in the range of 130s.  Blood pressure was stable.  Chest x-ray did not show pneumonia.  BMP showed calcium of 14.8.  Lactic acid was 2.4.  Urinalysis was positive for UTI.  He was started on antibiotics, culture sent.  Given a dose of Zometa and started on calcitonin, IV fluids. CT head did not show any acute intracranial abnormalities.  Assessment & Plan:   Principal Problem:   Acute encephalopathy Active Problems:   Primary cancer of right upper lobe of lung (HCC)   Hypercalcemia   Macrocytic anemia   Acute lower UTI   Hypercalcemia:Most likely associated  with dehydration, immobility and also cud be due to his lung cancer.  PTH is low suggesting suppression of PTH due to high calcium level..  Calcium level has  improved with current regimen.  Continue IV fluids.  He looked very dehydrated on presentation.  Acute encephalopathy: Could be from multiple etiologies: Hypercalcemia, UTI, advanced age.  CT head did not show any acute intracranial abnormalities.  Monitor mental status.  Continue supportive care.  Urinary tract infection: Urine culture showing gram-negative rods.  Currently on ceftriaxone.  Follow final culture report.  Urine culture on 03/03/2020 had shown pansensitive E. Coli.  Right non-small cell lung cancer: Currently being treated with radiation therapy. There is plan to follow-up with medical oncology for possible immunotherapy as per radiation  oncology note.  Chest x-ray did not show any acute findings.  Hyperlipidemia: On pravastatin at home.  Hypertension: On atenolol, hydrochlorothiazide at home.  Hypotensive today.  Will restart atenolol.  Generalized weakness/debility/deconditioning: We have requested for PT/OT evaluation.  Goals of care: Had a long discussion with the daughter at bedside today.  He is DNR.  I clearly think that the patient is a candidate for residential hospice given his advanced age, multiple comorbidities especially lung cancer.  I have also requested palliative care consultation           DVT prophylaxis: Lovenox Code Status: Partial Family Communication: Discussed with daughter at bedside Disposition Plan: Patient is from home.  Not stable for discharge  because of severe generalized weakness.  Needs PT/OT evaluation before discharge planning.  Hospice candidate   Consultants: None  Procedures: None  Antimicrobials:  Anti-infectives (From admission, onward)   Start     Dose/Rate Route Frequency Ordered Stop   03/06/20 2200  cefTRIAXone (ROCEPHIN) 1 g in sodium chloride 0.9 % 100 mL IVPB     1 g 200 mL/hr over 30 Minutes Intravenous Every 24 hours 03/06/20 0758     03/06/20 0030  cefTRIAXone (ROCEPHIN) 1 g in sodium chloride 0.9 % 100 mL IVPB     1 g 200 mL/hr over 30 Minutes Intravenous  Once 03/06/20 0025 03/06/20 0110      Subjective: Patient seen and examined at the bedside this morning.  Hemodynamically stable but continues to remain weak, bedbound.  As per the daughter, he was mowing his lawn about 6 months ago but rapidly declined.  Patient is alert and awake, oriented to himself only.  Had a long discussion with daughter at the bedside  Objective: Vitals:   03/07/20 0019 03/07/20 0231 03/07/20 0500 03/07/20 0529  BP: (!) 135/97 121/88  (!) 161/92  Pulse: (!) 123 (!) 117  (!) 121  Resp: 20 18  19   Temp: (!) 97.5 F (36.4 C) (!) 97.1 F (36.2 C)  98.1 F (36.7 C)  TempSrc:       SpO2: 97% 99%  100%  Weight:   64 kg   Height:        Intake/Output Summary (Last 24 hours) at 03/07/2020 0852 Last data filed at 03/07/2020 0543 Gross per 24 hour  Intake 1496.98 ml  Output 1525 ml  Net -28.02 ml   Filed Weights   03/06/20 0300 03/06/20 1600 03/07/20 0500  Weight: 68.7 kg 68.7 kg 64 kg    Examination:  General exam: Extremely deconditioned, debilitated, chronically looking HEENT: Dry mouth, poor dental hygiene  respiratory system: Bilateral equal air entry, normal vesicular breath sounds, no wheezes or crackles  Cardiovascular system: S1 & S2 heard, RRR. No JVD, murmurs, rubs, gallops or clicks. No pedal edema. Gastrointestinal system: Abdomen is nondistended, soft and nontender. No organomegaly or masses felt. Normal bowel sounds heard. Central nervous system: Alert and awake but oriented to self only  extremities: No edema, no clubbing ,no cyanosis Skin: No rashes, lesions or ulcers,no icterus ,no pallor   Data Reviewed: I have personally reviewed following labs and imaging studies  CBC: Recent Labs  Lab 03/03/20 0836 03/06/20 0026 03/06/20 0515 03/07/20 0555  WBC 6.6 7.4 6.8 8.0  NEUTROABS 5.5 6.2 5.6 6.8  HGB 9.0* 10.2* 9.2* 10.4*  HCT 29.5* 33.0* 31.1* 35.1*  MCV 104.6* 104.4* 105.8* 106.7*  PLT 270 278 222 121   Basic Metabolic Panel: Recent Labs  Lab 03/03/20 0836 03/06/20 0026 03/06/20 0415 03/06/20 0434 03/07/20 0555  NA 140 142 145  --  143  K 3.9 3.6 2.8*  --  3.8  CL 101 108 116*  --  108  CO2 30 27 24   --  26  GLUCOSE 123* 117* 89  --  78  BUN 22 22 15   --  14  CREATININE 1.13 1.06 0.70  --  0.86  CALCIUM 14.2* 14.8* 11.1*  15.5*  --  11.7*  MG  --   --   --  1.5*  --    GFR: Estimated Creatinine Clearance: 52.7 mL/min (by C-G formula based on SCr of 0.86 mg/dL). Liver Function Tests: Recent Labs  Lab 03/03/20 0836 03/06/20 0026 03/06/20 0415  AST 31 34 26  ALT 18 23 18   ALKPHOS 70 70 53  BILITOT 0.7 0.6 0.5   PROT 7.7 7.9 5.7*  ALBUMIN 3.1* 3.0* 2.2*   No results for input(s): LIPASE, AMYLASE in the last 168 hours. No results for input(s): AMMONIA in the last 168 hours. Coagulation Profile: Recent Labs  Lab 03/06/20 0025  INR 1.1   Cardiac Enzymes: No results for input(s): CKTOTAL, CKMB, CKMBINDEX, TROPONINI in the last 168 hours. BNP (last 3 results) No results for input(s): PROBNP in the last 8760 hours. HbA1C: No results for input(s): HGBA1C in the last 72 hours. CBG: No results for input(s): GLUCAP in the last 168 hours. Lipid Profile: No results for input(s): CHOL, HDL, LDLCALC, TRIG, CHOLHDL, LDLDIRECT in the last 72 hours. Thyroid Function Tests: No results for input(s): TSH, T4TOTAL, FREET4, T3FREE, THYROIDAB in the last 72 hours.  Anemia Panel: No results for input(s): VITAMINB12, FOLATE, FERRITIN, TIBC, IRON, RETICCTPCT in the last 72 hours. Sepsis Labs: Recent Labs  Lab 03/06/20 0030 03/06/20 0430  LATICACIDVEN 2.4* 2.6*    Recent Results (from the past 240 hour(s))  Urine culture     Status: Abnormal   Collection Time: 03/03/20  8:36 AM   Specimen: Urine, Random  Result Value Ref Range Status   Specimen Description   Final    URINE, RANDOM Performed at [facility] [address], [place], [place] [address]    Special Requests   Final    NONE Performed at [facility], [place] [address] [place], [place] [address]    Culture >=100,000 COLONIES/mL ESCHERICHIA COLI (A)  Final   Report Status 03/05/2020 FINAL  Final   Organism ID, Bacteria ESCHERICHIA COLI (A)  Final      Susceptibility   Escherichia coli - MIC*    AMPICILLIN <=2 SENSITIVE Sensitive     CEFAZOLIN <=4 SENSITIVE Sensitive     CEFTRIAXONE <=0.25 SENSITIVE Sensitive     CIPROFLOXACIN <=0.25 SENSITIVE Sensitive     GENTAMICIN <=1 SENSITIVE Sensitive     IMIPENEM <=0.25 SENSITIVE Sensitive     NITROFURANTOIN <=16 SENSITIVE Sensitive     TRIMETH/SULFA <=20  SENSITIVE Sensitive     AMPICILLIN/SULBACTAM <=2 SENSITIVE Sensitive     PIP/TAZO <=4 SENSITIVE Sensitive     * >=100,000 COLONIES/mL ESCHERICHIA COLI  Blood Culture (routine x 2)     Status: None (Preliminary result)   Collection Time: 03/05/20 11:54 PM   Specimen: BLOOD  Result Value Ref Range Status   Specimen Description   Final    BLOOD RIGHT ARM Performed at [facility] [address], [place], [place] [address]    Special Requests   Final    BOTTLES DRAWN AEROBIC AND ANAEROBIC Blood Culture adequate volume Performed at [facility] [address] [place], [place] [address]    Culture   Final    NO GROWTH 1 DAY Performed at [facility], [place] [address], [place], [place] [address]    Report Status PENDING  Incomplete  Urine culture     Status: Abnormal (Preliminary result)   Collection Time: 03/05/20 11:54 PM   Specimen: In/Out Cath Urine  Result Value Ref Range Status   Specimen Description   Final    IN/OUT CATH URINE Performed at [facility] [address] [place], [place] [address]    Special Requests   Final    NONE Performed at [facility], [place] [address], [place], [place] [address]    Culture >=100,000 COLONIES/mL GRAM NEGATIVE RODS (A)  Final   Report Status PENDING  Incomplete  Blood Culture (routine x 2)     Status: None (Preliminary result)   Collection Time: 03/05/20 11:59 PM   Specimen: BLOOD  Result Value Ref Range Status   Specimen Description   Final    BLOOD LEFT ARM Performed at [facility] [address] [place], [place] [address]    Special Requests   Final    BOTTLES DRAWN AEROBIC AND ANAEROBIC Blood Culture results may not be optimal due to an excessive volume of blood received in culture bottles Performed at [facility] [address], [place],  [address]    Culture   Final    NO GROWTH 1 DAY Performed  at [facility], [address] [place], [place] [address]    Report Status PENDING  Incomplete  SARS CORONAVIRUS 2 (TAT 6-24 HRS) Nasopharyngeal Nasopharyngeal Swab     Status: None   Collection Time: 03/06/20  3:22 AM   Specimen: Nasopharyngeal Swab  Result Value Ref Range Status   SARS Coronavirus 2 NEGATIVE NEGATIVE Final    Comment: (NOTE) SARS-CoV-2 target nucleic acids are NOT DETECTED. The SARS-CoV-2 RNA is generally detectable in upper and lower respiratory specimens during the acute phase of infection. Negative results do not preclude SARS-CoV-2 infection, do not rule out co-infections with other pathogens, and should not be used as the sole basis for treatment or other patient management decisions. Negative results must be combined with clinical observations, patient history, and epidemiological information. The expected result is Negative. Fact Sheet for Patients: SugarRoll.be Fact Sheet for Healthcare Providers: https://www.woods-mathews.com/ This test is not yet approved or cleared by the [place] FDA and  has been authorized for detection and/or diagnosis of SARS-CoV-2 by FDA under an Emergency Use Authorization (EUA). This EUA will remain  in effect (meaning this test can be used) for the duration of the COVID-19 declaration under Section 56 4(b)(1) of the Act, 21 U.S.C. section 360bbb-3(b)(1), unless the authorization is terminated or revoked sooner. Performed at [facility], [place] [address] [place], [place] [address]          Radiology Studies: DG Chest Port 1 View  Result Date: 03/06/2020 CLINICAL DATA:  Generalized weakness EXAM: PORTABLE CHEST 1 VIEW COMPARISON:  03/03/2020 FINDINGS: The heart size and mediastinal contours are within normal limits. Both lungs are clear. The visualized skeletal structures are unremarkable. Atherosclerotic changes are noted of the thoracic aorta. IMPRESSION: No  active disease. Electronically Signed   By: Constance Holster M.D.   On: 03/06/2020 01:29        Scheduled Meds: . calcitonin  270 Units Intramuscular BID  . enoxaparin (LOVENOX) injection  40 mg Subcutaneous Q24H  . potassium chloride  40 mEq Oral Once  . sodium chloride flush  3 mL Intravenous Q12H   Continuous Infusions: . cefTRIAXone (ROCEPHIN)  IV 1 g (03/06/20 2253)     LOS: 1 day    Time spent: 35 mins.More than 50% of that time was spent in counseling and/or coordination of care.      Shelly Coss, MD [facility] P4/06/2020, 8:52 AM

## 2020-03-08 DIAGNOSIS — C3411 Malignant neoplasm of upper lobe, right bronchus or lung: Secondary | ICD-10-CM | POA: Diagnosis not present

## 2020-03-08 DIAGNOSIS — G9340 Encephalopathy, unspecified: Secondary | ICD-10-CM | POA: Diagnosis not present

## 2020-03-08 DIAGNOSIS — G893 Neoplasm related pain (acute) (chronic): Secondary | ICD-10-CM | POA: Diagnosis not present

## 2020-03-08 LAB — BASIC METABOLIC PANEL
Anion gap: 15 (ref 5–15)
BUN: 15 mg/dL (ref 8–23)
CO2: 20 mmol/L — ABNORMAL LOW (ref 22–32)
Calcium: 10.4 mg/dL — ABNORMAL HIGH (ref 8.9–10.3)
Chloride: 110 mmol/L (ref 98–111)
Creatinine, Ser: 0.89 mg/dL (ref 0.61–1.24)
GFR calc Af Amer: >60 mL/min (ref >60)
GFR calc non Af Amer: >60 mL/min (ref >60)
Glucose, Bld: 83 mg/dL (ref 70–99)
Potassium: 4 mmol/L (ref 3.5–5.1)
Sodium: 145 mmol/L (ref 135–145)

## 2020-03-08 LAB — URINE CULTURE: Culture: >=100000 — AB

## 2020-03-08 MED ORDER — BIOTENE DRY MOUTH MT LIQD: 15.0000 mL | OROMUCOSAL | Status: DC | PRN

## 2020-03-08 MED ORDER — GLYCOPYRROLATE 0.2 MG/ML IJ SOLN: 0.2000 mg | INTRAMUSCULAR | Status: DC | PRN

## 2020-03-08 MED ORDER — LORAZEPAM 2 MG/ML IJ SOLN: 1.0000 mg | INTRAMUSCULAR | Status: DC | PRN

## 2020-03-08 MED ORDER — LORAZEPAM 1 MG PO TABS: 1.0000 mg | ORAL_TABLET | ORAL | Status: DC | PRN

## 2020-03-08 MED ORDER — MORPHINE SULFATE (PF) 2 MG/ML IV SOLN
1.0000 mg | INTRAVENOUS | Status: DC | PRN
  Administered 2020-03-08 – 2020-03-09 (×2): 2 mg via INTRAVENOUS
  Filled 2020-03-08 (×2): qty 1

## 2020-03-08 MED ORDER — METOPROLOL TARTRATE 5 MG/5ML IV SOLN: 2.5000 mg | Freq: Four times a day (QID) | INTRAVENOUS | Status: DC | PRN

## 2020-03-08 MED ORDER — HALOPERIDOL LACTATE 2 MG/ML PO CONC
0.5000 mg | ORAL | Status: DC | PRN
  Filled 2020-03-08: qty 0.3

## 2020-03-08 MED ORDER — METOPROLOL TARTRATE 5 MG/5ML IV SOLN
5.0000 mg | INTRAVENOUS | Status: AC | PRN
  Administered 2020-03-08: 02:00:00 5 mg via INTRAVENOUS
  Filled 2020-03-08: qty 5

## 2020-03-08 MED ORDER — POLYVINYL ALCOHOL 1.4 % OP SOLN: 1.0000 [drp] | Freq: Four times a day (QID) | OPHTHALMIC | Status: DC | PRN

## 2020-03-08 MED ORDER — METOPROLOL TARTRATE 5 MG/5ML IV SOLN
2.5000 mg | Freq: Four times a day (QID) | INTRAVENOUS | Status: DC | PRN
  Administered 2020-03-08 – 2020-03-09 (×2): 2.5 mg via INTRAVENOUS
  Filled 2020-03-08 (×2): qty 5

## 2020-03-08 MED ORDER — GLYCOPYRROLATE 1 MG PO TABS
1.0000 mg | ORAL_TABLET | ORAL | Status: DC | PRN
  Filled 2020-03-08: qty 1

## 2020-03-08 MED ORDER — HALOPERIDOL LACTATE 5 MG/ML IJ SOLN: 0.5000 mg | INTRAMUSCULAR | Status: DC | PRN

## 2020-03-08 MED ORDER — HALOPERIDOL 0.5 MG PO TABS
0.5000 mg | ORAL_TABLET | ORAL | Status: DC | PRN
  Filled 2020-03-08: qty 1

## 2020-03-08 MED ORDER — LORAZEPAM 2 MG/ML PO CONC: 1.0000 mg | ORAL | Status: DC | PRN

## 2020-03-08 NOTE — Discharge Summary (Addendum)
Physician Discharge Summary  Frank [name] DTO:[id number] DOB: 08/19/1928 DOA: 03/05/2020  PCP: Seward Carol, MD  Admit date: 03/05/2020 Discharge date: 03/09/2020  Admitted From: Home Disposition:  Residential Hospice  Discharge Condition:Stable CODE STATUS:DNR Diet recommendation: Regular   Brief/Interim Summary: Patient is 86 year old male with history of hypertension, former smoker,Rightlungnon-small cellcancerbeingtreated with radiation who presentsfrom homewith progressive lethargy and confusion. He was noted to have generalized weakness, confusion starting about a week ago. He lives with his girlfriend and walks with the help of walker.  Upon presentation, he was confused, was in sinus tachycardia in the range of 130s. Blood pressure was stable. Chest x-ray did not show pneumonia. BMP showed calcium of 14.8. Lactic acid was 2.4. Urinalysis was positive for UTI. He was started on antibiotics, culture sent. Given a dose of Zometa and started on calcitonin,IV fluids. CT head did not show any acute intracranial abnormalities. Hospital course was remarkable for persistent lethargy, unresponsiveness.  Due to his advanced age, multiple comorbidities including lung cancer we discussed goals of care.  Palliative care was also following.  Comfort care started.  Plan is to discharge him to residential hospice.  Following problems were addressed during her hospitalization:  Hypercalcemia:Most likelyassociatedwith dehydration, immobility and also cud be due to his lung cancer. PTH is low suggesting suppression of PTH due to high calcium level.. Calcium level has  improved with IV fluids, calcitonin, Zometa  Acute encephalopathy: Could be from multiple etiologies: Hypercalcemia, UTI, advanced age.  CT head did not show any acute intracranial abnormalities.   Urinary tract infection: Urine culture showed Ecoli  Right non-small cell lung cancer: Currently being treated with  radiation therapy.There was plan to follow-up with medical oncology for possible immunotherapy as per radiationoncology note. Chest x-ray did not show any acute findings.  Hyperlipidemia: On pravastatin at home.  Hypertension: On atenolol, hydrochlorothiazide at home.   Goals of care: Due to his advanced age, multiple comorbidities including lung cancer we discussed goals of care.  Palliative care was following.  Comfort care started.  Plan is to discharge him to residential hospice.   Discharge Diagnoses:  Principal Problem:   Acute encephalopathy Active Problems:   Primary cancer of right upper lobe of lung (HCC)   Hypercalcemia   Macrocytic anemia   Acute lower UTI    Discharge Instructions  Discharge Instructions    Diet general   Complete by: As directed      Allergies as of 03/09/2020   No Known Allergies     Medication List    STOP taking these medications   atenolol 50 MG tablet Commonly known as: TENORMIN   hydrochlorothiazide 25 MG tablet Commonly known as: HYDRODIURIL   omeprazole 20 MG capsule Commonly known as: PRILOSEC   pravastatin 40 MG tablet Commonly known as: PRAVACHOL       No Known Allergies  Consultations:  Palliative care   Procedures/Studies: DG Chest 2 View  Result Date: 03/03/2020 CLINICAL DATA:  Weakness with fall EXAM: CHEST - 2 VIEW COMPARISON:  September 28, 2008 FINDINGS: Lungs are clear. Heart size and pulmonary vascularity are normal. No adenopathy. No pneumothorax. No bone lesions. IMPRESSION: Lungs clear.  Cardiac silhouette within normal limits. Electronically Signed   By: Lowella Grip III M.D.   On: 03/03/2020 09:23   CT Head Wo Contrast  Result Date: 03/03/2020 CLINICAL DATA:  Pain following fall. History of prostate and lung carcinoma EXAM: CT HEAD WITHOUT CONTRAST TECHNIQUE: Contiguous axial images were obtained from the base of  the skull through the vertex without intravenous contrast. COMPARISON:  Head CT  August 29, 2018; brain MRI October 10, 2020 FINDINGS: Brain: Moderate diffuse atrophy appear stable. There is no intracranial mass, hemorrhage, extra-axial fluid collection, or midline shift. There is patchy small vessel disease in the centra semiovale bilaterally. No acute appearing infarct is demonstrable on this study. Foci of basal ganglia calcification is physiologic in this age group. Vascular: There is no hyperdense vessel. There are foci of calcification in the carotid siphon regions. Skull: The bony calvarium appears intact. Sinuses/Orbits: Visualized paranasal sinuses are clear. Orbits appear symmetric bilaterally. Other: Mastoid air cells are clear. IMPRESSION: Atrophy with periventricular small vessel disease, stable. No acute infarct evident. No mass or hemorrhage. There are foci of arterial vascular calcification. Electronically Signed   By: Lowella Grip III M.D.   On: 03/03/2020 09:06   DG Chest Port 1 View  Result Date: 03/06/2020 CLINICAL DATA:  Generalized weakness EXAM: PORTABLE CHEST 1 VIEW COMPARISON:  03/03/2020 FINDINGS: The heart size and mediastinal contours are within normal limits. Both lungs are clear. The visualized skeletal structures are unremarkable. Atherosclerotic changes are noted of the thoracic aorta. IMPRESSION: No active disease. Electronically Signed   By: Constance Holster M.D.   On: 03/06/2020 01:29   DG Hip Unilat W or Wo Pelvis 2-3 Views Left  Result Date: 03/03/2020 CLINICAL DATA:  Pain following fall EXAM: DG HIP (WITH OR WITHOUT PELVIS) 2-3V LEFT COMPARISON:  None. FINDINGS: Frontal pelvis as well as frontal and lateral left hip images were obtained. No fracture or dislocation. There is symmetric moderate joint space narrowing in each hip joint. No erosive change. There is degenerative change in the lower lumbar spine region. IMPRESSION: Moderate symmetric hip joint space narrowing. There is also degenerative change in the lower lumbar region. No  fracture or dislocation. Electronically Signed   By: Lowella Grip III M.D.   On: 03/03/2020 09:24      Subjective: Patient seen and examined the bedside this morning.  Remains very lethargic, unresponsive during my evaluation.  Discharge Exam: Vitals:   03/08/20 1213 03/09/20 0908  BP:  (!) 130/93  Pulse: 86 (!) 122  Resp:  17  Temp:  98.3 F (36.8 C)  SpO2: 98% 100%   Vitals:   03/08/20 0441 03/08/20 0937 03/08/20 1213 03/09/20 0908  BP:  124/78  (!) 130/93  Pulse:  (!) 116 86 (!) 122  Resp:  (!) 22  17  Temp: (!) 100.4 F (38 C) 97.6 F (36.4 C)  98.3 F (36.8 C)  TempSrc:  Axillary  Oral  SpO2:  93% 98% 100%  Weight:      Height:          The results of significant diagnostics from this hospitalization (including imaging, microbiology, ancillary and laboratory) are listed below for reference.     Microbiology: Recent Results (from the past 240 hour(s))  Urine culture     Status: Abnormal   Collection Time: 03/03/20  8:36 AM   Specimen: Urine, Random  Result Value Ref Range Status   Specimen Description   Final    URINE, RANDOM Performed at [facility] [address] [place], [place] [address]    Special Requests   Final    NONE Performed at [facility], [place] [address], [place], [place] [address]    Culture >=100,000 COLONIES/mL ESCHERICHIA COLI (A)  Final   Report Status 03/05/2020 FINAL  Final   Organism ID,  Bacteria ESCHERICHIA COLI (A)  Final      Susceptibility   Escherichia coli - MIC*    AMPICILLIN <=2 SENSITIVE Sensitive     CEFAZOLIN <=4 SENSITIVE Sensitive     CEFTRIAXONE <=0.25 SENSITIVE Sensitive     CIPROFLOXACIN <=0.25 SENSITIVE Sensitive     GENTAMICIN <=1 SENSITIVE Sensitive     IMIPENEM <=0.25 SENSITIVE Sensitive     NITROFURANTOIN <=16 SENSITIVE Sensitive     TRIMETH/SULFA <=20 SENSITIVE Sensitive     AMPICILLIN/SULBACTAM <=2 SENSITIVE Sensitive     PIP/TAZO <=4 SENSITIVE Sensitive      * >=100,000 COLONIES/mL ESCHERICHIA COLI  Blood Culture (routine x 2)     Status: None (Preliminary result)   Collection Time: 03/05/20 11:54 PM   Specimen: BLOOD  Result Value Ref Range Status   Specimen Description   Final    BLOOD RIGHT ARM Performed at [facility] [address], [place], [place] [address]    Special Requests   Final    BOTTLES DRAWN AEROBIC AND ANAEROBIC Blood Culture adequate volume Performed at [facility] [address] [place], [place] [address]    Culture   Final    NO GROWTH 2 DAYS Performed at [facility] [address], [place], [place] [address]    Report Status PENDING  Incomplete  Urine culture     Status: Abnormal   Collection Time: 03/05/20 11:54 PM   Specimen: In/Out Cath Urine  Result Value Ref Range Status   Specimen Description   Final    IN/OUT CATH URINE Performed at [facility] [address] [place], [place] [address]    Special Requests   Final    NONE Performed at [facility], [place] [address], [place], [place] [address]    Culture >=100,000 COLONIES/mL ESCHERICHIA COLI (A)  Final   Report Status 03/08/2020 FINAL  Final   Organism ID, Bacteria ESCHERICHIA COLI (A)  Final      Susceptibility   Escherichia coli - MIC*    AMPICILLIN <=2 SENSITIVE Sensitive     CEFAZOLIN <=4 SENSITIVE Sensitive     CEFTRIAXONE <=0.25 SENSITIVE Sensitive     CIPROFLOXACIN <=0.25 SENSITIVE Sensitive     GENTAMICIN <=1 SENSITIVE Sensitive     IMIPENEM <=0.25 SENSITIVE Sensitive     NITROFURANTOIN <=16 SENSITIVE Sensitive     TRIMETH/SULFA <=20 SENSITIVE Sensitive     AMPICILLIN/SULBACTAM <=2 SENSITIVE Sensitive     PIP/TAZO <=4 SENSITIVE Sensitive     * >=100,000 COLONIES/mL ESCHERICHIA COLI  Blood Culture (routine x 2)     Status: None (Preliminary result)   Collection Time: 03/05/20 11:59 PM   Specimen: BLOOD  Result Value Ref Range Status   Specimen  Description   Final    BLOOD LEFT ARM Performed at [facility] [address] [place], [place] [address]    Special Requests   Final    BOTTLES DRAWN AEROBIC AND ANAEROBIC Blood Culture results may not be optimal due to an excessive volume of blood received in culture bottles Performed at [facility] [address], [place],  [address]    Culture   Final    NO GROWTH 2 DAYS Performed at [facility] [address] [place], [place] [address]    Report Status PENDING  Incomplete  SARS CORONAVIRUS 2 (TAT 6-24 HRS) Nasopharyngeal Nasopharyngeal Swab     Status: None   Collection Time:  03/06/20  3:22 AM   Specimen: Nasopharyngeal Swab  Result Value Ref Range Status   SARS Coronavirus 2 NEGATIVE NEGATIVE Final    Comment: (NOTE) SARS-CoV-2 target nucleic acids are NOT DETECTED. The SARS-CoV-2 RNA is generally detectable in upper and lower respiratory specimens during the acute phase of infection. Negative results do not preclude SARS-CoV-2 infection, do not rule out co-infections with other pathogens, and should not be used as the sole basis for treatment or other patient management decisions. Negative results must be combined with clinical observations, patient history, and epidemiological information. The expected result is Negative. Fact Sheet for Patients: SugarRoll.be Fact Sheet for Healthcare Providers: https://www.woods-mathews.com/ This test is not yet approved or cleared by the [place] FDA and  has been authorized for detection and/or diagnosis of SARS-CoV-2 by FDA under an Emergency Use Authorization (EUA). This EUA will remain  in effect (meaning this test can be used) for the duration of the COVID-19 declaration under Section 56 4(b)(1) of the Act, 21 U.S.C. section 360bbb-3(b)(1), unless the authorization is terminated or revoked sooner. Performed at [facility], [place] [address], [place], [place] [address]      Labs: BNP (last 3 results) No results for input(s): BNP in the last 8760 hours. Basic Metabolic Panel: Recent Labs  Lab 03/03/20 0836 03/03/20 0836 03/06/20 0026 03/06/20 0415 03/06/20 0434 03/07/20 0555 03/08/20 0600  NA 140  --  142 145  --  143 145  K 3.9  --  3.6 2.8*  --  3.8 4.0  CL 101  --  108 116*  --  108 110  CO2 30  --  27 24  --  26 20*  GLUCOSE 123*  --  117* 89  --  78 83  BUN 22  --  22 15  --  14 15  CREATININE 1.13  --  1.06 0.70  --  0.86 0.89  CALCIUM 14.2*   < > 14.8* 11.1*  15.5*  --  11.7* 10.4*  MG  --   --   --   --  1.5*  --   --    < > = values in this interval not displayed.   Liver Function Tests: Recent Labs  Lab 03/03/20 0836 03/06/20 0026 03/06/20 0415  AST 31 34 26  ALT 18 23 18   ALKPHOS 70 70 53  BILITOT 0.7 0.6 0.5  PROT 7.7 7.9 5.7*  ALBUMIN 3.1* 3.0* 2.2*   No results for input(s): LIPASE, AMYLASE in the last 168 hours. No results for input(s): AMMONIA in the last 168 hours. CBC: Recent Labs  Lab 03/03/20 0836 03/06/20 0026 03/06/20 0515 03/07/20 0555  WBC 6.6 7.4 6.8 8.0  NEUTROABS 5.5 6.2 5.6 6.8  HGB 9.0* 10.2* 9.2* 10.4*  HCT 29.5* 33.0* 31.1* 35.1*  MCV 104.6* 104.4* 105.8* 106.7*  PLT 270 278 222 283   Cardiac Enzymes: No results for input(s): CKTOTAL, CKMB, CKMBINDEX, TROPONINI in the last 168 hours. BNP: Invalid input(s): POCBNP CBG: No results for input(s): GLUCAP in the last 168 hours. D-Dimer No results for input(s): DDIMER in the last 72 hours. Hgb A1c No results for input(s): HGBA1C in the last 72 hours. Lipid Profile No results for input(s): CHOL, HDL, LDLCALC, TRIG, CHOLHDL, LDLDIRECT in the last 72 hours. Thyroid function studies No results for input(s): TSH, T4TOTAL, T3FREE, THYROIDAB in the last 72 hours.  Invalid input(s): FREET3 Anemia work up No results for input(s): VITAMINB12, FOLATE, FERRITIN, TIBC, IRON, RETICCTPCT  in the  last 72 hours. Urinalysis    Component Value Date/Time   COLORURINE YELLOW 03/05/2020 0030   APPEARANCEUR HAZY (A) 03/05/2020 0030   LABSPEC 1.019 03/05/2020 0030   PHURINE 5.0 03/05/2020 0030   GLUCOSEU NEGATIVE 03/05/2020 0030   HGBUR NEGATIVE 03/05/2020 0030   BILIRUBINUR NEGATIVE 03/05/2020 0030   KETONESUR NEGATIVE 03/05/2020 0030   PROTEINUR NEGATIVE 03/05/2020 0030   UROBILINOGEN 1.0 09/28/2008 1400   NITRITE POSITIVE (A) 03/05/2020 0030   LEUKOCYTESUR MODERATE (A) 03/05/2020 0030   Sepsis Labs Invalid input(s): PROCALCITONIN,  WBC,  LACTICIDVEN Microbiology Recent Results (from the past 240 hour(s))  Urine culture     Status: Abnormal   Collection Time: 03/03/20  8:36 AM   Specimen: Urine, Random  Result Value Ref Range Status   Specimen Description   Final    URINE, RANDOM Performed at [facility], [place] [address] [place], [place] [address]    Special Requests   Final    NONE Performed at [facility], [place] [address], [place], [place] [address]    Culture >=100,000 COLONIES/mL ESCHERICHIA COLI (A)  Final   Report Status 03/05/2020 FINAL  Final   Organism ID, Bacteria ESCHERICHIA COLI (A)  Final      Susceptibility   Escherichia coli - MIC*    AMPICILLIN <=2 SENSITIVE Sensitive     CEFAZOLIN <=4 SENSITIVE Sensitive     CEFTRIAXONE <=0.25 SENSITIVE Sensitive     CIPROFLOXACIN <=0.25 SENSITIVE Sensitive     GENTAMICIN <=1 SENSITIVE Sensitive     IMIPENEM <=0.25 SENSITIVE Sensitive     NITROFURANTOIN <=16 SENSITIVE Sensitive     TRIMETH/SULFA <=20 SENSITIVE Sensitive     AMPICILLIN/SULBACTAM <=2 SENSITIVE Sensitive     PIP/TAZO <=4 SENSITIVE Sensitive     * >=100,000 COLONIES/mL ESCHERICHIA COLI  Blood Culture (routine x 2)     Status: None (Preliminary result)   Collection Time: 03/05/20 11:54 PM   Specimen: BLOOD  Result Value Ref Range Status   Specimen Description   Final    BLOOD RIGHT ARM Performed at [facility] [address] [place], [place] [address]    Special Requests   Final    BOTTLES DRAWN AEROBIC AND ANAEROBIC Blood Culture adequate volume Performed at [facility] [address] [place], [place] [address]    Culture   Final    NO GROWTH 2 DAYS Performed at [facility] [address], [place], [place] [address]    Report Status PENDING  Incomplete  Urine culture     Status: Abnormal   Collection Time: 03/05/20 11:54 PM   Specimen: In/Out Cath Urine  Result Value Ref Range Status   Specimen Description   Final    IN/OUT CATH URINE Performed at [facility] [address], [place], [place] [address]    Special Requests   Final    NONE Performed at [facility], [place] [address] [place], [place] [address]    Culture >=100,000 COLONIES/mL ESCHERICHIA COLI (A)  Final   Report Status 03/08/2020 FINAL  Final   Organism ID, Bacteria ESCHERICHIA COLI (A)  Final      Susceptibility   Escherichia coli - MIC*    AMPICILLIN <=2 SENSITIVE Sensitive     CEFAZOLIN <=4 SENSITIVE Sensitive     CEFTRIAXONE <=0.25 SENSITIVE Sensitive     CIPROFLOXACIN <=0.25 SENSITIVE Sensitive     GENTAMICIN <=1 SENSITIVE Sensitive  IMIPENEM <=0.25 SENSITIVE Sensitive     NITROFURANTOIN <=16 SENSITIVE Sensitive     TRIMETH/SULFA <=20 SENSITIVE Sensitive     AMPICILLIN/SULBACTAM <=2 SENSITIVE Sensitive     PIP/TAZO <=4 SENSITIVE Sensitive     * >=100,000 COLONIES/mL ESCHERICHIA COLI  Blood Culture (routine x 2)     Status: None (Preliminary result)   Collection Time: 03/05/20 11:59 PM   Specimen: BLOOD  Result Value Ref Range Status   Specimen Description   Final    BLOOD LEFT ARM Performed at [facility] [address], [place], [place] [address]    Special Requests   Final    BOTTLES DRAWN AEROBIC AND ANAEROBIC Blood Culture results may not be optimal due to an excessive volume of blood  received in culture bottles Performed at [facility] [address] [place], [place] [address]    Culture   Final    NO GROWTH 2 DAYS Performed at [facility] [address], [place], [place] [address]    Report Status PENDING  Incomplete  SARS CORONAVIRUS 2 (TAT 6-24 HRS) Nasopharyngeal Nasopharyngeal Swab     Status: None   Collection Time: 03/06/20  3:22 AM   Specimen: Nasopharyngeal Swab  Result Value Ref Range Status   SARS Coronavirus 2 NEGATIVE NEGATIVE Final    Comment: (NOTE) SARS-CoV-2 target nucleic acids are NOT DETECTED. The SARS-CoV-2 RNA is generally detectable in upper and lower respiratory specimens during the acute phase of infection. Negative results do not preclude SARS-CoV-2 infection, do not rule out co-infections with other pathogens, and should not be used as the sole basis for treatment or other patient management decisions. Negative results must be combined with clinical observations, patient history, and epidemiological information. The expected result is Negative. Fact Sheet for Patients: SugarRoll.be Fact Sheet for Healthcare Providers: https://www.woods-mathews.com/ This test is not yet approved or cleared by the [place] FDA and  has been authorized for detection and/or diagnosis of SARS-CoV-2 by FDA under an Emergency Use Authorization (EUA). This EUA will remain  in effect (meaning this test can be used) for the duration of the COVID-19 declaration under Section 56 4(b)(1) of the Act, 21 U.S.C. section 360bbb-3(b)(1), unless the authorization is terminated or revoked sooner. Performed at [facility], [place] [address] [place], [place] [address]     Please note: You were cared for by a hospitalist during your hospital stay. Once you are discharged, your primary care physician will handle any further medical issues. Please note that NO REFILLS for any discharge  medications will be authorized once you are discharged, as it is imperative that you return to your primary care physician (or establish a relationship with a primary care physician if you do not have one) for your post hospital discharge needs so that they can reassess your need for medications and monitor your lab values.    Time coordinating discharge: 40 minutes  SIGNED:   Shelly Coss, MD  [facility] 03/09/2020, 9:50 AM Pager [phone]  If 7PM-7AM, please contact night-coverage [email or web address] Password [email or web address]

## 2020-03-08 NOTE — Consult Note (Signed)
Palliative Care Consult Note  Reason for consult: Goals of care in light of NSCLC  Palliative care consult received.  Chart reviewed including personal review of pertinent labs and imaging.  Briefly, Mr. Bauserman is an 86 year old male with PMHx HTN, NSCLC on radiation who was admitted with lethargy and weakness.  He lives with a girlfriend who reports continued decline in his functional status over the past few days.  He was found on admission to have encephalopathy related to UTI and hypercalcemia.    Chart review and discussion with bedside care team reveals concern regarding decision making for Mr. Sheen.  He is noted in chart at times to be oriented and at other times not to be oriented.  His daughter has been in contact with care team and desires to make decisions on behalf of her father rather than his girlfriend serving in this capacity.  I saw and examined Mr. Skufca today.  He is a frail, elderly appearing male wearing mitts at time of encounter.  He is awake and attempts to engage with me, but he mumbles and also is not oriented to his situation.  He knows his name and family when mentioned by name, but he cannot tell me where we are or why he is here.  He cannot follow when I attempted to explain to him.  I attempted to ask whom I should call regarding how he is doing and he reported his daughter.  I next called and reached patient's daughter.  His daughter reports that family cares deeply for her father and want to be part of his care.  Unfortunately, relationship has been strained after patient's wife died 3 years ago and his wife's friend moved into the house and has been living with Mr. Fath since this time.  This has been a source of strain on the family.  There is no paperwork at the hospital naming a surrogate decision maker for Mr. Blakeman.  His children would therefore be his legal decision-makers in this situation.  His daughter states that she and her brothers would like to act  as his Media planner.  We discussed his clinical course this admission and continued decline he has been suffering in regard to his nutrition, cognition, and functional status in light of his lung cancer.  We reviewed his current medical conditions in detail.  His daughter reports that she does not think that he can return to his previous home and we discussed potential hospice support.  His girlfriend is noted to report that she cannot care for him any longer.  She had discussed with Dr. Tawanna Solo regarding potential hospice on discharge.  His daughter reports that she is open to consideration for this, and we discussed options for hospice care.  We discussed plan to continue to monitor his course overnight to continue to assess appropriateness for potential residential hospice.  She reports that she believes that her brothers would be in agreement with plan for residential hospice if he qualifies.  She would like to have family meeting in person tomorrow with one of her brothers to discuss next steps.  - DNR/DNI - Mr. Seymore was unable to participate in conversation today due to confusion. - His children desire to make decisions on his behalf.  His daughter reports that she began discussion with Dr. Tawanna Solo regarding potential for hospice.  She believes that this would likely be in line with her father and family wishes. - Plan for follow-up meeting tomorrow at 41 with  family to discuss further regarding goals of care.  Total time: 80 minutes  Greater than 50%  of this time was spent counseling and coordinating care related to the above assessment and plan.  Micheline Rough, MD [facility] [phone]

## 2020-03-08 NOTE — [place] Notes (Signed)
Chaplain consulting in response to spiritual care consult Re: palliative care.   Providing support with pt's children in context of pt transition to hospice.  Family reports their mother received care at [facility] as well prior to her death 3 years ago.    Provided support around family narrative, identifying moments of eulogizing and honor for pt.   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    Family expresses concern around pt's spiritual well-being.  Chaplain provided space to reflect on pt's faith affirmation of family's prayers.    This family understands that [facility] is available 24 hours.  Please page [phone]  With spiritual support needs.     Jerene Pitch, MDiv, [facility]

## 2020-03-08 NOTE — TOC Transition Note (Addendum)
Transition of Care [facility]) - CM/SW Discharge Note   Patient Details  Name: Frank [name] MRN: [id number] Date of Birth: [birth year]  Transition of Care [facility]) CM/SW Contact:  Trish Mage, LCSW Phone Number: 03/08/2020, 11:49 AM   Clinical Narrative:   Was approached by Dr Domingo Cocking who had just come from meeting with family.  They are requesting [facility] for hospice care.  Contacted Venia Carbon to make referral.  She will get back to me with updates as she has them. TOC will continue to follow during the course of hospitalization.  Addendum:  Patient has been accepted to [facility]  No beds today.     Final next level of care: [place] Barriers to Discharge: No Barriers Identified   Patient Goals and CMS Choice        Discharge Placement                       Discharge Plan and Services In-house Referral: Clinical Social Work                                   Social Determinants of Health (SDOH) Interventions     Readmission Risk Interventions No flowsheet data found.

## 2020-03-08 NOTE — Progress Notes (Signed)
[facility] Documentation  Received referral for pt to transfer to [facility] for EOL care. Pt's chart reviewed and pt deemed eligible for [facility] placement.   Writer left VM with daughter, Ladona Mow, to confirm interest. Pt can discharged to [facility] as soon as we confirm interest with family and have paperwork completed.   Liaison will notify TOC with any updates.   Thank you for the referral.   Freddie Breech, RN [facility] Liaison [phone]

## 2020-03-08 NOTE — Evaluation (Signed)
Clinical/Bedside Swallow Evaluation Patient Details  Name: Frank [name] MRN: [id number] Date of Birth: Apr 09, 1928  Today's Date: 03/08/2020 Time: SLP Start Time (ACUTE ONLY): 1015 SLP Stop Time (ACUTE ONLY): 1035 SLP Time Calculation (min) (ACUTE ONLY): 20 min  Past Medical History:  Past Medical History:  Diagnosis Date  . Cancer ([place])   . High cholesterol   . Hypertension    Past Surgical History:  Past Surgical History:  Procedure Laterality Date  . BACK SURGERY    . HERNIA REPAIR    . JOINT REPLACEMENT     HPI:  Patient is 86 year old male with history of hypertension, former smoker,Rightlungnon-small cellcancerbeingtreated with radiation who presentsfrom homewith progressive lethargy and confusion. He was noted to have generalized weakness, confusion starting about a week ago. He lives with his girlfriend and walks with the help of walker.  Chest x-ray did not show pneumonia. Urinalysis was positive for UTI. He was started on antibiotics, culture sent. CT head did not show any acute intracranial abnormalities. Pt is dehydrated.    Assessment / Plan / Recommendation Clinical Impression  Pt demonstrates insufficient arousal for safe PO intake. SLP repositioned pt and provided total assist for neutral head position. Increased lighting, cold washcloth, oral care which minimally improved arousal. With max tactile and hand over hand cueing pt did accept a few pieces of ice and tiny sips of water from a cup and straw, but swallow initiation very late, with multiple swallows with overtly incomplete hyoid excursion and eventual coughing and choking behavior. Suspected pt not fully clearing pooled pharyngeal secretions or water. Suction set up and used to assist pt in clearing with weak cough. Pt may improve, but very dependent on arousal. Will continue efforts. Decision to offer clear liquids with known risk if pts arousal improves over the weekend deferred to MD given care plan.   SLP Visit Diagnosis: Dysphagia, unspecified (R13.10)    Aspiration Risk  Severe aspiration risk;Risk for inadequate nutrition/hydration    Diet Recommendation NPO;Thin liquid(if alert for comfort)   Medication Administration: Crushed with puree    Other  Recommendations Oral Care Recommendations: Oral care QID Other Recommendations: Have oral suction available   Follow up Recommendations Skilled Nursing facility      Frequency and Duration min 2x/week  2 weeks       Prognosis Prognosis for Safe Diet Advancement: Fair Barriers to Reach Goals: Cognitive deficits      Swallow Study   General HPI: Patient is 86 year old male with history of hypertension, former smoker,Rightlungnon-small cellcancerbeingtreated with radiation who presentsfrom homewith progressive lethargy and confusion. He was noted to have generalized weakness, confusion starting about a week ago. He lives with his girlfriend and walks with the help of walker.  Chest x-ray did not show pneumonia. Urinalysis was positive for UTI. He was started on antibiotics, culture sent. CT head did not show any acute intracranial abnormalities. Pt is dehydrated.  Type of Study: Bedside Swallow Evaluation Previous Swallow Assessment: none Diet Prior to this Study: Thin liquids Temperature Spikes Noted: No Respiratory Status: Room air;Nasal cannula History of Recent Intubation: No Behavior/Cognition: Lethargic/Drowsy;Doesn't follow directions;Confused Oral Cavity Assessment: Dry Oral Care Completed by SLP: Yes Oral Cavity - Dentition: Missing dentition;Poor condition;Dentures, bottom;Dentures, top(denstures removed) Vision: Impaired for self-feeding Self-Feeding Abilities: Total assist Patient Positioning: Postural control interferes with function Baseline Vocal Quality: Low vocal intensity Volitional Cough: Wet;Congested;Weak Volitional Swallow: Unable to elicit    Oral/Motor/Sensory Function Overall Oral  Motor/Sensory Function: Generalized oral weakness   Ice  Chips Ice chips: Impaired Presentation: Spoon Oral Phase Impairments: Poor awareness of bolus Pharyngeal Phase Impairments: Suspected delayed Swallow;Decreased hyoid-laryngeal movement;Multiple swallows;Wet Vocal Quality;Throat Clearing - Immediate;Cough - Immediate   Thin Liquid Thin Liquid: Impaired Presentation: Cup;Straw Oral Phase Impairments: Poor awareness of bolus Pharyngeal  Phase Impairments: Multiple swallows;Wet Vocal Quality;Cough - Immediate    Nectar Thick Nectar Thick Liquid: Not tested   Honey Thick Honey Thick Liquid: Not tested   Puree Puree: Not tested   Solid     Solid: Not tested     Frank Baltimore, MA CCC-SLP  Acute Rehabilitation Services Pager [phone] Office [phone]   Frank [name] 03/08/2020,11:31 AM

## 2020-03-08 NOTE — Progress Notes (Signed)
[facility] Documentation  Pt's daughter has agreed to have pt placed at [facility]. Unfortunately, we no longer have a bed available. We will update TOC and family if one becomes available today and/or tomorrow.   Thank you,  Freddie Breech, RN [facility] Liaison [phone]

## 2020-03-08 NOTE — Progress Notes (Signed)
Daily Progress Note   Patient Name: Frank [name]       Date: 03/08/2020 DOB: 01/14/1928  Age: 86 y.o. MRN#: [id number] Attending Physician: Shelly Coss, MD Primary Care Physician: Seward Carol, MD Admit Date: 03/05/2020  Reason for Consultation/Follow-up: Establishing goals of care  Subjective: I saw and examined Frank [name] this morning.  He has had a significant change from yesterday and is no longer awake and does not interact with me.  His bedside nurse reports that he has been unable to be aroused all morning.  I met today with patient's daughter, son-in-law, son, and goddaughter.  We reviewed his clinical course including acute change from yesterday today with increased somnolence.  Discussed etiology of his decline with cancer, hypercalcemia, and poor intake leading to dehydration.  I shared my concern with family that Frank [name] appears to be transitioning to actively dying.  Yesterday, he was awake and alert and able to attempt to participate in conversation.  Today he would not wake and is not taking in any nutrition or hydration.  In discussion with bedside care team, he has not taken in any nutrition and hydration by mouth since his arrival to the hospital.  We discussed options for care moving forward including continuation of current interventions versus consideration for transitioning to comfort care focus.  His family is familiar with residential hospice from patient's wife whenever she died 3 years ago.  We discussed comfort care with a focus on pain management, shortness of breath, agitation, anxiety, and aggressive management of other symptoms as they arise.  Family agrees that Frank [name]'s desire and his best path forward would be for transitioning to residential hospice for  end-of-life care.  I then discussed at the bedside with his current girlfriend, Deena.  She is tearful but expresses understanding that he is dying and wants to ensure that he is comfortable.  We discussed [facility] as opportunity to allow him to be out of the hospital with focus on comfort and a situation where his care needs can be met.  She is in agreement that this would be best option moving forward as well.  Length of Stay: 2  Current Medications: Scheduled Meds:  . atenolol  25 mg Oral Daily  . feeding supplement  1 Container Oral TID BM  . feeding supplement (  PRO-STAT SUGAR FREE 64)  30 mL Oral BID  . sodium chloride flush  3 mL Intravenous Q12H    Continuous Infusions: . sodium chloride 75 mL/hr at 03/08/20 0908    PRN Meds: acetaminophen **OR** acetaminophen, antiseptic oral rinse, bisacodyl, glycopyrrolate **OR** glycopyrrolate **OR** glycopyrrolate, haloperidol **OR** haloperidol **OR** haloperidol lactate, LORazepam **OR** LORazepam **OR** LORazepam, metoprolol tartrate, morphine injection, ondansetron **OR** ondansetron (ZOFRAN) IV, polyvinyl alcohol  Physical Exam         General: Largely unarousable, some facial grimacing noted  HEENT: No bruits, no goiter, no JVD Heart: Regular rate and rhythm. No murmur appreciated. Lungs: Decreased air movement, some excess secretions noted Abdomen: Soft, nontender, nondistended, positive bowel sounds.  Ext: No significant edema Skin: Warm and dry   Vital Signs: BP 124/78 (BP Location: Right Arm)   Pulse 86   Temp 97.6 F (36.4 C) (Axillary)   Resp (!) 22 Comment: lesia notified of vital signs  Ht 5' 11"  (1.803 m)   Wt 64 kg   SpO2 98%   BMI 19.69 kg/m  SpO2: SpO2: 98 % O2 Device: O2 Device: Nasal Cannula O2 Flow Rate: O2 Flow Rate (L/min): 3 L/min  Intake/output summary:   Intake/Output Summary (Last 24 hours) at 03/08/2020 1420 Last data filed at 03/08/2020 1638 Gross per 24 hour  Intake 2908.28 ml  Output 650  ml  Net 2258.28 ml   LBM: Last BM Date: 03/05/20 Baseline Weight: Weight: 68.7 kg Most recent weight: Weight: 64 kg       Palliative Assessment/Data:      Patient Active Problem List   Diagnosis Date Noted  . Acute encephalopathy 03/06/2020  . Hypercalcemia 03/06/2020  . Macrocytic anemia 03/06/2020  . Acute lower UTI 03/06/2020  . Primary cancer of right upper lobe of lung ([place]) 09/05/2019    Palliative Care Assessment & Plan   Assessment: 86 year old male with lung cancer who presented with hypercalcemia.  His condition is continued to decline he is largely somnolent today.  Plan moving forward will be for comfort care and residential hospice placement.  Recommendations/Plan: DNR/DNI.  Plan moving forward is for full comfort care.  I placed orders using end-of-life order set.  I discontinued interventions not specifically focused on his comfort.  Pain: Some facial grimacing noted.  We will begin with morphine 1 to 2 mg every 2 hours as needed.  Plan to titrate as needed to ensure his comfort  Anxiety: Ativan as needed  Agitation: Haldol as needed  Excess secretions: Robinul as needed  Overall goal is to work to transition to [facility] for end-of-life care.  Referral placed today for transition to [facility] when bed is available. Family is hopeful he will be able to be moved as soon as today.  Goals of Care and Additional Recommendations:  Limitations on Scope of Treatment: Full Comfort Care  Code Status:    Code Status Orders  (From admission, onward)         Start     Ordered   03/08/20 1159  Do not attempt resuscitation (DNR)  Continuous    Question Answer Comment  In the event of cardiac or respiratory ARREST Do not call a "code blue"   In the event of cardiac or respiratory ARREST Do not perform Intubation, CPR, defibrillation or ACLS   In the event of cardiac or respiratory ARREST Use medication by any route, position, wound care, and other  measures to relive pain and suffering. May use oxygen, suction and manual  treatment of airway obstruction as needed for comfort.      03/08/20 1201        Code Status History    Date Active Date Inactive Code Status Order ID Comments User Context   03/07/2020 1408 03/08/2020 1201 DNR [id number]  Shelly Coss, MD Inpatient   03/07/2020 1406 03/07/2020 1408 Partial Code [id number]  Shelly Coss, MD Inpatient   03/06/2020 0431 03/07/2020 1406 DNR [id number]  Vianne Bulls, MD ED   Advance Care Planning Activity       Prognosis:   < 2 weeks  Discharge Planning:  Hospice facility  Care plan was discussed with patient family, RN, Dr. Tawanna Solo  Thank you for allowing the Palliative Medicine Team to assist in the care of this patient.   Time In: 1115 Time Out: 1200 Total Time 45 Prolonged Time Billed No      Greater than 50%  of this time was spent counseling and coordinating care related to the above assessment and plan.  Micheline Rough, MD  Please contact Palliative Medicine Team phone at [phone] for questions and concerns.

## 2020-03-09 NOTE — Progress Notes (Signed)
[facility] Documentation     Pt has been approved for [facility] transfer. [facility] does have a bed available for pt today. Paperwork has been completed and transportation can be arranged.      Please call [place] [facility] at [phone] to give charge nurse report and fax discharge summary to [phone].     Please dc any lines. May leave catheter in place if pt has one. Please send pt to [facility] with DNR paperwork.      Please call with any questions.      Thank you,   Freddie Breech, RN

## 2020-03-09 NOTE — TOC Transition Note (Signed)
Transition of Care [facility]) - CM/SW Discharge Note   Patient Details  Name: Frank [name] MRN: [id number] Date of Birth: September 21, 1928  [facility]) CM/SW Contact:  Lennart Pall, LCSW Phone Number: 03/09/2020, 11:48 AM   Clinical Narrative:   Bed available at [facility] today.  Ambulance has been called and MD/RN aware.  No further needs.    Final next level of care: [place] Barriers to Discharge: Barriers Resolved   Patient Goals and CMS Choice        Discharge Placement                Patient to be transferred to facility by: [facility]      Discharge Plan and Services In-house Referral: Clinical Social Work              DME Arranged: N/A DME Agency: NA       HH Arranged: NA HH Agency: NA        Social Determinants of Health (SDOH) Interventions     Readmission Risk Interventions No flowsheet data found.

## 2020-03-09 NOTE — Progress Notes (Signed)
Report given to charge nurse, Helene Kelp, at [facility]. Daughter Z. Fabiola Backer contacted regarding transfer of patient. Attempted to contact significant other A. Marcello Moores but was unsuccessful and was unable to leave a [employer].

## 2020-03-09 NOTE — Progress Notes (Signed)
Patient seen and examined at the bedside this morning.  Hemodynamically stable.  He is more alert today.  Not in any kind of distress.  Plan is to discharge him to residential hospice today.  Discharge orders and summary have already been placed yesterday.  No new changes today.

## 2020-03-11 LAB — CULTURE, BLOOD (ROUTINE X 2)
Culture: NO GROWTH
Culture: NO GROWTH
Special Requests: ADEQUATE

## 2020-03-21 ENCOUNTER — Ambulatory Visit: Payer: Self-pay | Admitting: Radiation Oncology

## 2020-03-30 DEATH — deceased

## 2020-11-07 IMAGING — CT CT ABDOMEN AND PELVIS WITH CONTRAST
2 of 5 series · 16 of 46 positions shown, 18 images · IV contrast (Omni 300)
Comparison: None.

CLINICAL DATA: Left-sided groin pain

EXAM:
CT ABDOMEN AND PELVIS WITH CONTRAST
TECHNIQUE: Multidetector CT imaging of the abdomen and pelvis was performed
using the standard protocol following bolus administration of
intravenous contrast.
CONTRAST:  100mL OMNIPAQUE IOHEXOL 300 MG/ML  SOLN

[Series 3: a/p w/ 5mm · axial · 0.74mm/px · z∈[+869,+1239]mm · 13 of 84 slices shown, 15 images]
[im 5/84  soft-tissue]
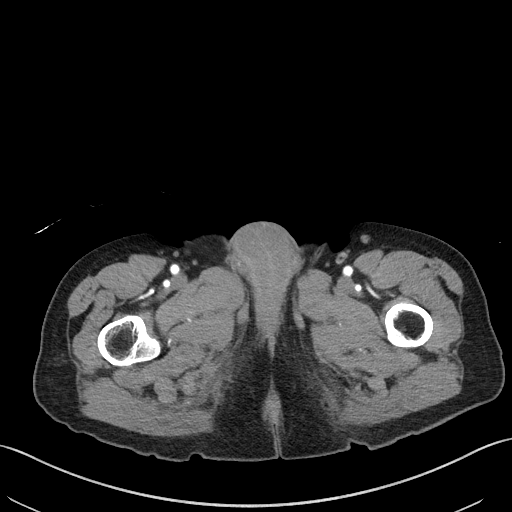
[im 5/84  bone]
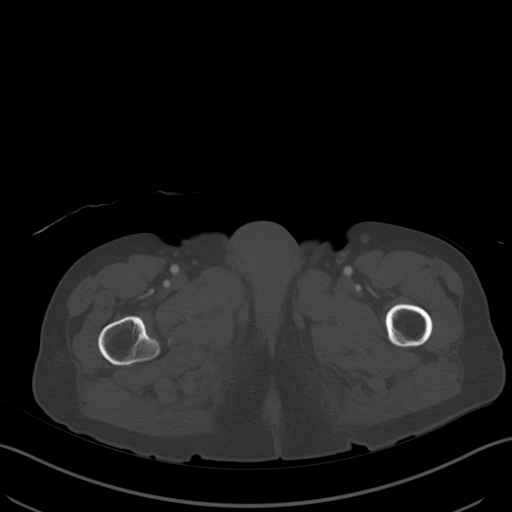
[im 14/84  soft-tissue]
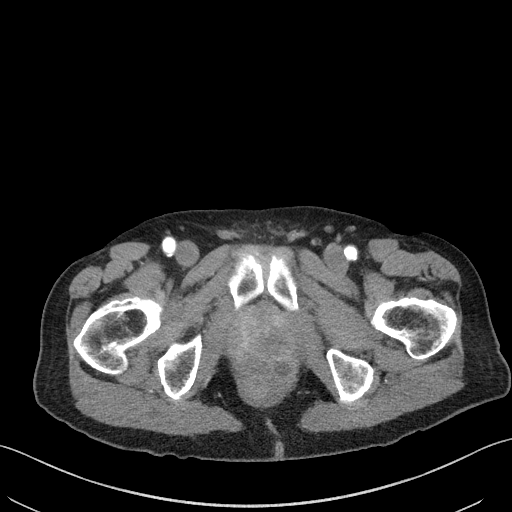
[im 18/84  soft-tissue]
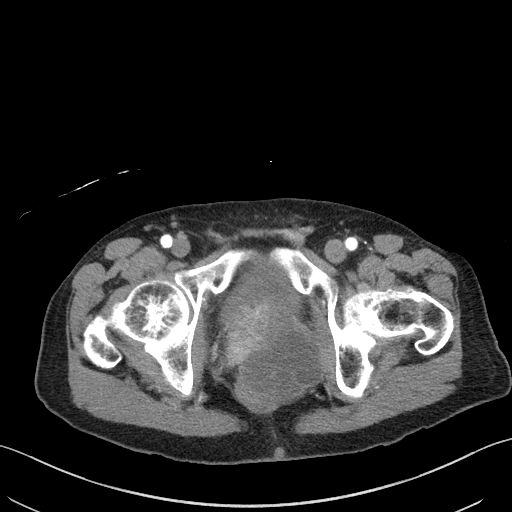
[im 22/84  soft-tissue]
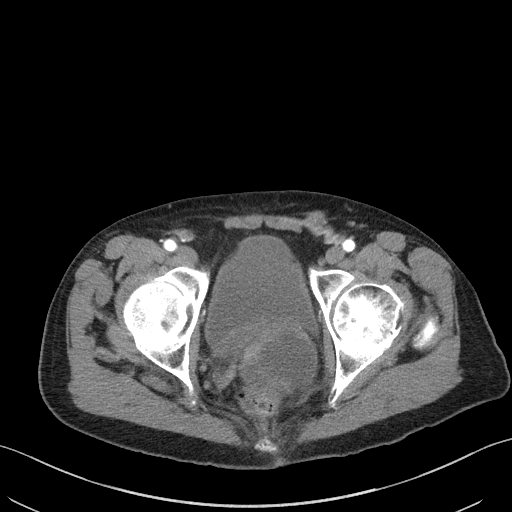
[im 31/84  soft-tissue]
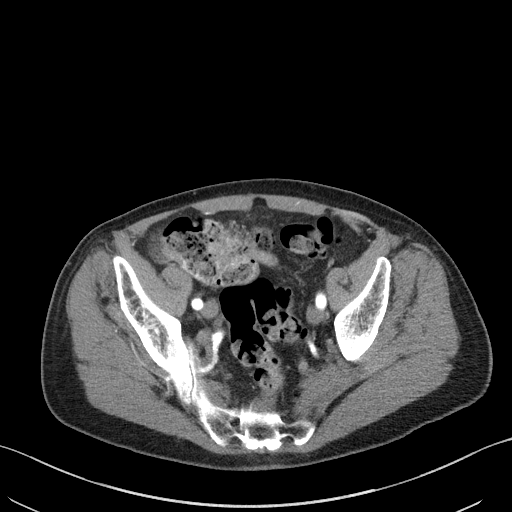
[im 35/84  soft-tissue]
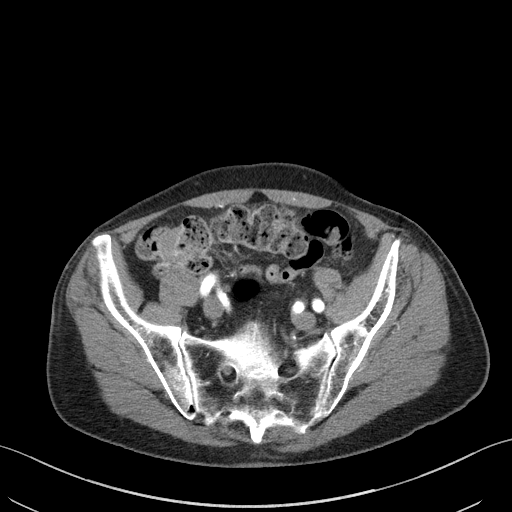
[im 44/84  soft-tissue]
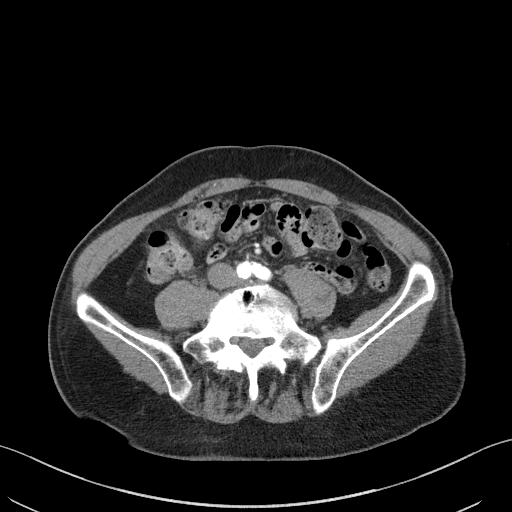
[im 49/84  soft-tissue]
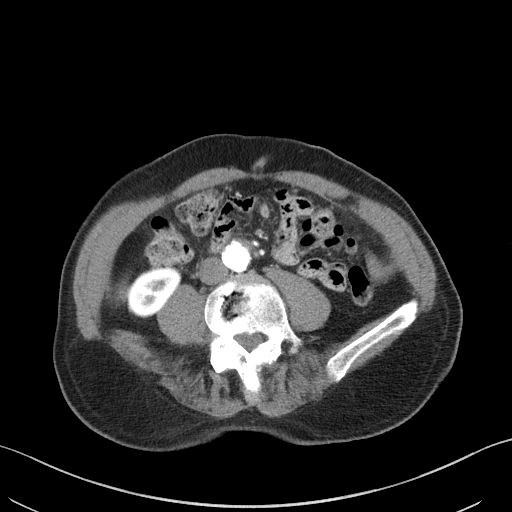
[im 53/84  soft-tissue]
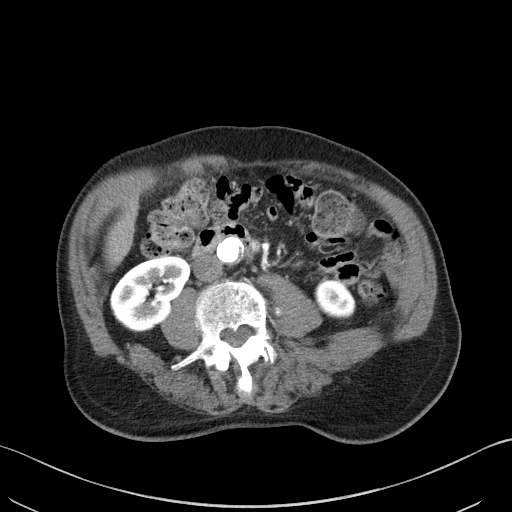
[im 53/84  bone]
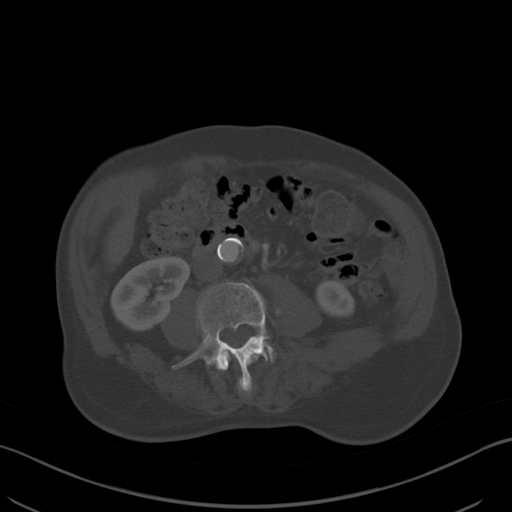
[im 62/84  soft-tissue]
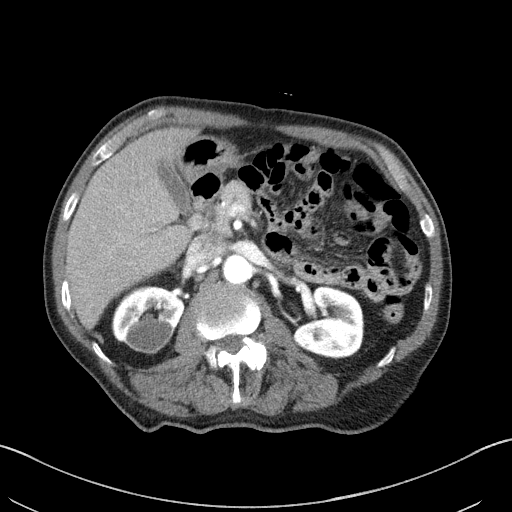
[im 66/84  soft-tissue]
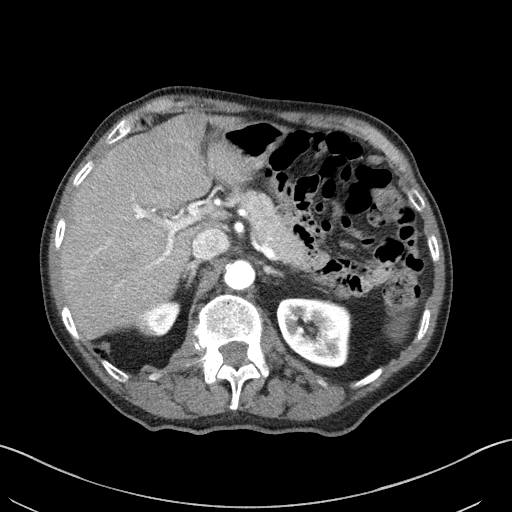
[im 70/84  soft-tissue]
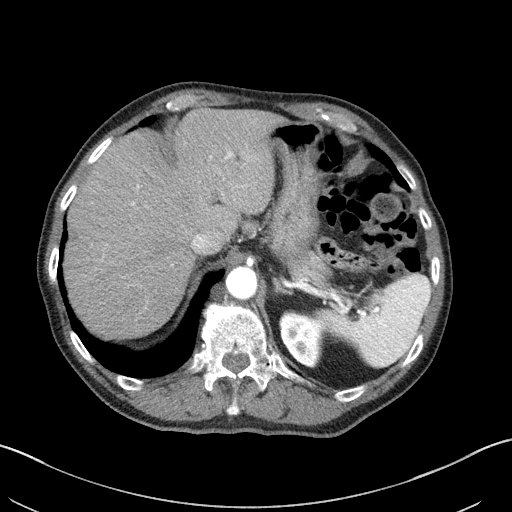
[im 79/84  soft-tissue]
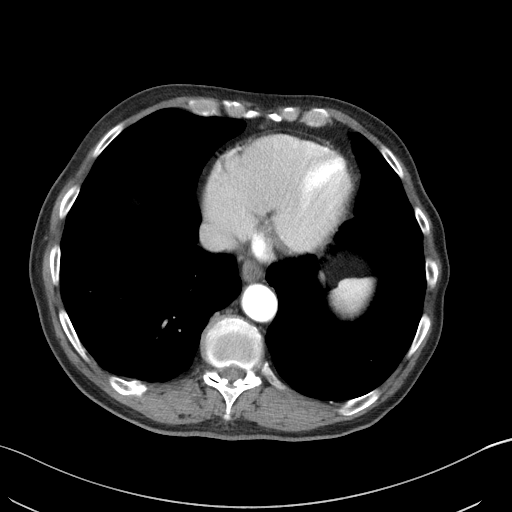

[Series 6: a/p w/ cor · coronal · 0.68mm/px · 3 of 151 slices shown]
[im 51/151  soft-tissue]
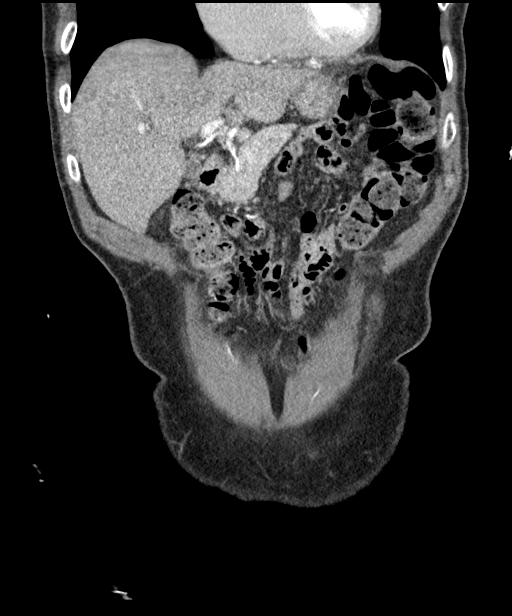
[im 67/151  soft-tissue]
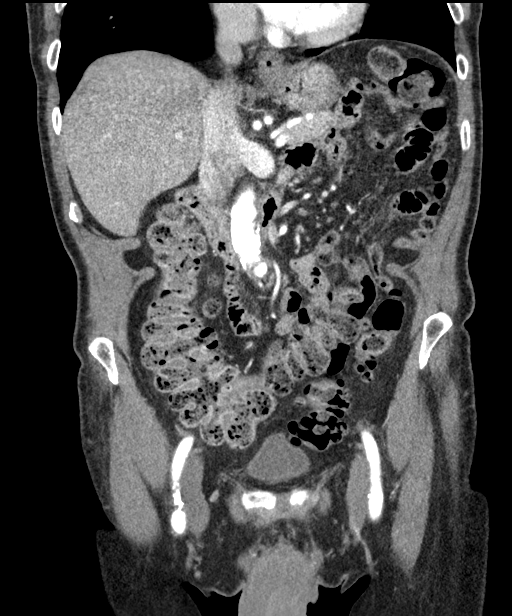
[im 84/151  soft-tissue]
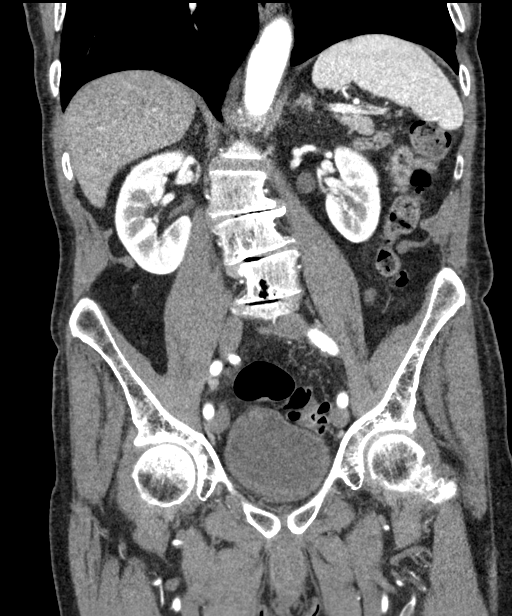

[16 of 46 positions shown; findings below may reference images not displayed]

FINDINGS: Lower chest: No acute abnormality.

Hepatobiliary: No solid liver abnormality is seen. No gallstones,
gallbladder wall thickening, or biliary dilatation.

Pancreas: Unremarkable. No pancreatic ductal dilatation or
surrounding inflammatory changes.

Spleen: Normal in size without significant abnormality.

Adrenals/Urinary Tract: Adrenal glands are unremarkable. Kidneys are
normal, without renal calculi, solid lesion, or hydronephrosis.
Bladder is unremarkable.

Stomach/Bowel: Stomach is within normal limits. Appendix appears
normal. No evidence of bowel wall thickening, distention, or
inflammatory changes. Severe sigmoid diverticulosis.

Vascular/Lymphatic: Aortic atherosclerosis. Prominent left pelvic
sidewall lymph nodes measuring up to 8 mm (series 3, image 57).

Reproductive: There is a large, hypodense mass which appears to
arise from the posterior left aspect of the prostate, measuring at
least 6.5 x 6.4 x 5.5 cm (series 3, image 67, series 6, image 110).

Other: Evidence of prior left inguinal hernia repair without
evidence of recurrent hernia. No abdominopelvic ascites.

Musculoskeletal: No acute or significant osseous findings.
IMPRESSION: 1. There is a large, hypodense mass which appears to arise from the
posterior left aspect of the prostate, measuring at least 6.5 x
x 5.5 cm (series 3, image 67, series 6, image 110). This is
suspicious for prostatic malignancy or alternately abscess. Rectal
mass is a differential consideration.

2. Prominent left pelvic sidewall lymph nodes measuring up to 8 mm
(series 3, image 57).

3.  Other chronic and incidental findings as detailed above.
